# Patient Record
Sex: Female | Born: 1960 | Race: White | Hispanic: No | Marital: Married | Smoking: Never smoker
Health system: Southern US, Community
[De-identification: ages and names within clinical notes are randomized; demographics above are authoritative.]

## PROBLEM LIST (undated history)

## (undated) DIAGNOSIS — M1990 Unspecified osteoarthritis, unspecified site: Secondary | ICD-10-CM

## (undated) DIAGNOSIS — K219 Gastro-esophageal reflux disease without esophagitis: Secondary | ICD-10-CM

## (undated) DIAGNOSIS — I10 Essential (primary) hypertension: Secondary | ICD-10-CM

## (undated) HISTORY — PX: WISDOM TOOTH EXTRACTION: SHX21

## (undated) HISTORY — PX: ABDOMINAL HYSTERECTOMY: SHX81

## (undated) HISTORY — PX: MENISCUS REPAIR: SHX5179

## (undated) HISTORY — PX: COLONOSCOPY: SHX174

## (undated) HISTORY — PX: OOPHORECTOMY: SHX86

## (undated) HISTORY — PX: TONSILLECTOMY: SUR1361

## (undated) HISTORY — DX: Essential (primary) hypertension: I10

---

## 2009-07-09 ENCOUNTER — Encounter: Admission: RE | Admit: 2009-07-09 | Discharge: 2009-07-09 | Payer: Self-pay | Admitting: Obstetrics and Gynecology

## 2009-08-04 ENCOUNTER — Encounter (INDEPENDENT_AMBULATORY_CARE_PROVIDER_SITE_OTHER): Payer: Self-pay | Admitting: *Deleted

## 2009-08-08 HISTORY — PX: COLONOSCOPY: SHX174

## 2009-08-10 ENCOUNTER — Encounter (INDEPENDENT_AMBULATORY_CARE_PROVIDER_SITE_OTHER): Payer: Self-pay

## 2009-08-12 ENCOUNTER — Ambulatory Visit: Payer: Self-pay | Admitting: Gastroenterology

## 2010-02-01 ENCOUNTER — Ambulatory Visit (HOSPITAL_BASED_OUTPATIENT_CLINIC_OR_DEPARTMENT_OTHER): Admission: RE | Admit: 2010-02-01 | Discharge: 2010-02-01 | Payer: Self-pay | Admitting: Orthopedic Surgery

## 2010-09-07 NOTE — Miscellaneous (Signed)
Summary: Lec previsit  Clinical Lists Changes  Medications: Added new medication of MOVIPREP 100 GM  SOLR (PEG-KCL-NACL-NASULF-NA ASC-C) As per prep instructions. - Signed Rx of MOVIPREP 100 GM  SOLR (PEG-KCL-NACL-NASULF-NA ASC-C) As per prep instructions.;  #1 x 0;  Signed;  Entered by: Ulis Rias RN;  Authorized by: Louis Meckel MD;  Method used: Electronically to [facility] [phone]*, [address] [place], [place], [place]  [address], Ph: [phone] or [phone], Fax: [phone] Observations: Added new observation of NKA: T (08/12/2009 9:50)    Prescriptions: MOVIPREP 100 GM  SOLR (PEG-KCL-NACL-NASULF-NA ASC-C) As per prep instructions.  #1 x 0   Entered by:   Ulis Rias RN   Authorized by:   Louis Meckel MD   Signed by:   Ulis Rias RN on 08/12/2009   Method used:   Electronically to        [facility] [phone]* (retail)       [address]       [place], [place]  [address]       Ph: [phone] or [phone]       Fax: [phone]   RxID:   [id number]

## 2010-09-07 NOTE — Letter (Signed)
Summary: [facility] Instructions  [facility]  [address] [place], [place] [address]   Phone: [phone]  Fax: [phone]       [facility]    Jul 21, 1961    MRN: [id number]        Procedure Day Dorna Bloom: Rica Mote. 01/25     Arrival Time:  10:30am     Procedure Time:  11:30am     Location of Procedure:                    _x _  [facility] (4th Floor)                        PREPARATION FOR COLONOSCOPY WITH MOVIPREP   Starting 5 days prior to your procedure 01/20 . do not eat nuts, seeds, popcorn, corn, beans, peas,  salads, or any raw vegetables.  Do not take any fiber supplements (e.g. Metamucil, Citrucel, and Benefiber).  THE DAY BEFORE YOUR PROCEDURE      DATE: 1/24, Mon.  1.  Drink clear liquids the entire day-NO SOLID FOOD  2.  Do not drink anything colored red or purple.  Avoid juices with pulp.  No orange juice.  3.  Drink at least 64 oz. (8 glasses) of fluid/clear liquids during the day to prevent dehydration and help the prep work efficiently.  CLEAR LIQUIDS INCLUDE: Water Jello Ice Popsicles Tea (sugar ok, no milk/cream) Powdered fruit flavored drinks Coffee (sugar ok, no milk/cream) Gatorade Juice: apple, white grape, white cranberry  Lemonade Clear bullion, consomm, broth Carbonated beverages (any kind) Strained chicken noodle soup Hard Candy                             4.  In the morning, mix first dose of MoviPrep solution:    Empty 1 Pouch A and 1 Pouch B into the disposable container    Add lukewarm drinking water to the top line of the container. Mix to dissolve    Refrigerate (mixed solution should be used within 24 hrs)  5.  Begin drinking the prep at 5:00 p.m. The MoviPrep container is divided by 4 marks.   Every 15 minutes drink the solution down to the next mark (approximately 8 oz) until the full liter is complete.   6.  Follow completed prep with 16 oz of clear liquid of your choice (Nothing red or  purple).  Continue to drink clear liquids until bedtime.  7.  Before going to bed, mix second dose of MoviPrep solution:    Empty 1 Pouch A and 1 Pouch B into the disposable container    Add lukewarm drinking water to the top line of the container. Mix to dissolve    Refrigerate  THE DAY OF YOUR PROCEDURE     DATE:Tues. 01/25  Beginning at 6:30am . (5 hours before procedure):         1. Every 15 minutes, drink the solution down to the next mark (approx 8 oz) until the full liter is complete.  2. Follow completed prep with 16 oz. of clear liquid of your choice.    3. You may drink clear liquids until 9:30am  (2 HOURS BEFORE PROCEDURE).   MEDICATION INSTRUCTIONS  Unless otherwise instructed, you should take regular prescription medications with a small sip of water   as early as possible the morning of your procedure.  OTHER INSTRUCTIONS  You will need a responsible adult at least 50 years of age to accompany you and drive you home.   This person must remain in the waiting room during your procedure.  Wear loose fitting clothing that is easily removed.  Leave jewelry and other valuables at home.  However, you may wish to bring a book to read or  an iPod/MP3 player to listen to music as you wait for your procedure to start.  Remove all body piercing jewelry and leave at home.  Total time from sign-in until discharge is approximately 2-3 hours.  You should go home directly after your procedure and rest.  You can resume normal activities the  day after your procedure.  The day of your procedure you should not:   Drive   Make legal decisions   Operate machinery   Drink alcohol   Return to work  You will receive specific instructions about eating, activities and medications before you leave.    The above instructions have been reviewed and explained to me by   Ulis Rias RN  August 12, 2009 10:15 AM     I fully understand and can verbalize these  instructions _____________________________ Date _________

## 2010-10-19 ENCOUNTER — Other Ambulatory Visit: Payer: Self-pay | Admitting: Obstetrics and Gynecology

## 2010-10-19 DIAGNOSIS — Z1231 Encounter for screening mammogram for malignant neoplasm of breast: Secondary | ICD-10-CM

## 2010-10-24 LAB — POCT HEMOGLOBIN-HEMACUE: Hemoglobin: 13.3 g/dL (ref 12.0–15.0)

## 2010-10-27 ENCOUNTER — Ambulatory Visit
Admission: RE | Admit: 2010-10-27 | Discharge: 2010-10-27 | Disposition: A | Discharge status: Home / Self Care | Payer: BC Managed Care – PPO | Source: Ambulatory Visit | Attending: Obstetrics and Gynecology | Admitting: Obstetrics and Gynecology

## 2010-10-27 DIAGNOSIS — Z1231 Encounter for screening mammogram for malignant neoplasm of breast: Secondary | ICD-10-CM

## 2011-11-08 ENCOUNTER — Other Ambulatory Visit: Payer: Self-pay | Admitting: Obstetrics and Gynecology

## 2011-11-08 DIAGNOSIS — Z1231 Encounter for screening mammogram for malignant neoplasm of breast: Secondary | ICD-10-CM

## 2011-11-17 ENCOUNTER — Ambulatory Visit
Admission: RE | Admit: 2011-11-17 | Discharge: 2011-11-17 | Disposition: A | Discharge status: Home / Self Care | Payer: BC Managed Care – PPO | Source: Ambulatory Visit | Attending: Obstetrics and Gynecology | Admitting: Obstetrics and Gynecology

## 2011-11-17 DIAGNOSIS — Z1231 Encounter for screening mammogram for malignant neoplasm of breast: Secondary | ICD-10-CM

## 2012-11-19 ENCOUNTER — Other Ambulatory Visit: Payer: Self-pay

## 2012-11-19 DIAGNOSIS — Z1231 Encounter for screening mammogram for malignant neoplasm of breast: Secondary | ICD-10-CM

## 2012-12-13 ENCOUNTER — Ambulatory Visit
Admission: RE | Admit: 2012-12-13 | Discharge: 2012-12-13 | Disposition: A | Discharge status: Home / Self Care | Payer: 59 | Source: Ambulatory Visit

## 2012-12-13 DIAGNOSIS — Z1231 Encounter for screening mammogram for malignant neoplasm of breast: Secondary | ICD-10-CM

## 2014-10-22 ENCOUNTER — Inpatient Hospital Stay (HOSPITAL_COMMUNITY)
Admission: RE | Admit: 2014-10-22 | Discharge: 2014-10-22 | Disposition: A | Discharge status: Home / Self Care | Payer: Self-pay | Source: Ambulatory Visit

## 2014-10-23 ENCOUNTER — Other Ambulatory Visit (HOSPITAL_COMMUNITY): Payer: Self-pay

## 2014-10-29 ENCOUNTER — Ambulatory Visit (HOSPITAL_COMMUNITY)
Admission: RE | Admit: 2014-10-29 | Payer: Managed Care, Other (non HMO) | Source: Ambulatory Visit | Admitting: Obstetrics and Gynecology

## 2014-10-29 ENCOUNTER — Encounter (HOSPITAL_COMMUNITY): Admission: RE | Payer: Self-pay | Source: Ambulatory Visit

## 2014-10-29 SURGERY — HYSTERECTOMY, VAGINAL
Anesthesia: General

## 2015-08-24 ENCOUNTER — Ambulatory Visit: Payer: Managed Care, Other (non HMO) | Admitting: Podiatry

## 2016-03-18 ENCOUNTER — Telehealth: Payer: Self-pay | Admitting: Gastroenterology

## 2016-03-18 NOTE — Telephone Encounter (Signed)
Received previous colon report and placed on Dr. Eugenia Pancoast desk for review

## 2016-03-24 NOTE — Telephone Encounter (Signed)
Dr.Jacobs has reviewed records and has noted for patient to have recall in 09/2019. Patient has been notified of this and recall will be placed on chart.

## 2016-05-30 NOTE — Patient Instructions (Signed)
Your procedure is scheduled on:  Monday, Nov. 6, 2017  Enter through the [employer] [facility] at:  6:00 AM  Pick up the phone at the desk and dial [phone].  Call this number if you have problems the morning of surgery: [phone].  Remember: Do NOT eat food or drink after:  Midnight Sunday, Nov. 5, 2017  Take these medicines the morning of surgery with a SIP OF WATER:  None  Stop ALL herbal medications at this time   Do NOT wear jewelry (body piercing), metal hair clips/bobby pins, make-up, or nail polish. Do NOT wear lotions, powders, or perfumes.  You may wear deodorant. Do NOT shave for 48 hours prior to surgery. Do NOT bring valuables to the hospital. Contacts, dentures, or bridgework may not be worn into surgery.  Leave suitcase in car.  After surgery it may be brought to your room.  For patients admitted to the hospital, checkout time is 11:00 AM the day of discharge.

## 2016-05-31 ENCOUNTER — Encounter (HOSPITAL_COMMUNITY)
Admission: RE | Admit: 2016-05-31 | Discharge: 2016-05-31 | Disposition: A | Discharge status: Home / Self Care | Payer: Managed Care, Other (non HMO) | Source: Ambulatory Visit | Attending: Obstetrics and Gynecology | Admitting: Obstetrics and Gynecology

## 2016-05-31 ENCOUNTER — Encounter (HOSPITAL_COMMUNITY): Payer: Self-pay

## 2016-05-31 DIAGNOSIS — Z01812 Encounter for preprocedural laboratory examination: Secondary | ICD-10-CM | POA: Insufficient documentation

## 2016-05-31 HISTORY — DX: Unspecified osteoarthritis, unspecified site: M19.90

## 2016-05-31 LAB — COMPREHENSIVE METABOLIC PANEL
ALK PHOS: 68 U/L (ref 38–126)
ALT: 15 U/L (ref 14–54)
AST: 21 U/L (ref 15–41)
Albumin: 4.2 g/dL (ref 3.5–5.0)
Anion gap: 6 (ref 5–15)
BILIRUBIN TOTAL: 0.7 mg/dL (ref 0.3–1.2)
BUN: 21 mg/dL — AB (ref 6–20)
CALCIUM: 9.6 mg/dL (ref 8.9–10.3)
CO2: 29 mmol/L (ref 22–32)
CREATININE: 0.88 mg/dL (ref 0.44–1.00)
Chloride: 104 mmol/L (ref 101–111)
GFR calc Af Amer: >60 mL/min (ref >60)
GLUCOSE: 102 mg/dL — AB (ref 65–99)
Potassium: 4.2 mmol/L (ref 3.5–5.1)
Sodium: 139 mmol/L (ref 135–145)
TOTAL PROTEIN: 7 g/dL (ref 6.5–8.1)

## 2016-05-31 LAB — TYPE AND SCREEN
ABO/RH(D): A POS
Antibody Screen: NEGATIVE

## 2016-05-31 LAB — CBC
HEMATOCRIT: 38.9 % (ref 36.0–46.0)
HEMOGLOBIN: 13 g/dL (ref 12.0–15.0)
MCH: 28.9 pg (ref 26.0–34.0)
MCHC: 33.4 g/dL (ref 30.0–36.0)
MCV: 86.4 fL (ref 78.0–100.0)
Platelets: 258 10*3/uL (ref 150–400)
RBC: 4.5 MIL/uL (ref 3.87–5.11)
RDW: 13 % (ref 11.5–15.5)
WBC: 10 10*3/uL (ref 4.0–10.5)

## 2016-05-31 LAB — ABO/RH: ABO/RH(D): A POS

## 2016-06-12 ENCOUNTER — Encounter (HOSPITAL_COMMUNITY): Payer: Self-pay | Admitting: Anesthesiology

## 2016-06-12 NOTE — H&P (Signed)
Heidi Reeves is an 55 y.o. female G27P2 Pt here for pelvic prolapse surgery planned last year but delayed due to her needing to care for her father who has since passed away from Stage 4 small cell lung cancer.  In her initial workup, Went through urodynamics with Dr. Jeni Salles and he felt she did not need to get a transurethral sling, but may benefit from mesh placement with an anterior repair and vault suspension. She does not want mesh, but just a hysterectomy and A/P Repair.   Since last year, her bladder urgency has increased, and she does have a couple of accidents here and there as well as mild SUI, but does not wear pads daily. Still sexually active and using lube.    She has a h/o oophorectomy (she thinks left)  for a large ovarian cyst, but not sure if tube removed.   She would like to retain her remaining ovary if not abnormal, ok with removing tubes if able.   History: Previous GYN Procedures: oophorectomy  Last pap: normal Date: 2015 and HPV negative OB History:  NSVD  X 2   Menstrual History:  No LMP recorded. Patient is not currently having periods (Reason: Perimenopausal).    Past Medical History:  Diagnosis Date  . Arthritis    bil knees    Past Surgical History:  Procedure Laterality Date  . COLONOSCOPY    . MENISCUS REPAIR    . OOPHORECTOMY      History reviewed. No pertinent family history.  Social History:  reports that she has never smoked. She has never used smokeless tobacco. She reports that she drinks alcohol. She reports that she does not use drugs.  Allergies: No Known Allergies  No prescriptions prior to admission.    Review of Systems  Gastrointestinal: Negative for abdominal pain and constipation.  Genitourinary: Positive for urgency.    There were no vitals taken for this visit. Physical Exam  Constitutional: She appears well-developed and well-nourished.  Cardiovascular: Normal rate and regular rhythm.   Respiratory: Effort normal.   Genitourinary:  Genitourinary Comments: Cystocele visible at os, Grade 3 Uterine Prolapse Grade 3 normal size Rectocele Grade 2   Neurological: She is alert.  Psychiatric: She has a normal mood and affect.    No results found for this or any previous visit (from the past 24 hour(s)).  No results found.  Assessment/Plan: : I reviewed with pt again pelvic prolapse in detail since has been over a year since we discussed. We discussed Dr. Coralee North findings with urodynamics and that he felt she could forego a transurethral sling for now. We discussed his recommendations of using mesh in her anterior repair and a vault suspension to cut down on recurrence in the future. We discussed pluses and minuses and she is just unsure she wishes to go with mesh. I reviewed with her a TVH/possible bilateral salpingectomy with anterior and posterior repair in detail and she would like to proceed with that. She realizes that there is a higher rate of recurrence without mesh, but would like to try without it initially.  Logan Bores 06/12/2016, 3:42 PM

## 2016-06-12 NOTE — Anesthesia Preprocedure Evaluation (Addendum)
Anesthesia Evaluation  Patient identified by MRN, date of birth, ID band Patient awake    Reviewed: Allergy & Precautions, NPO status , Patient's Chart, lab work & pertinent test results  Airway Mallampati: III  TM Distance: >3 FB Neck ROM: Full    Dental no notable dental hx. (+) Teeth Intact   Pulmonary neg pulmonary ROS,    Pulmonary exam normal breath sounds clear to auscultation       Cardiovascular negative cardio ROS Normal cardiovascular exam Rhythm:Regular Rate:Normal     Neuro/Psych negative neurological ROS  negative psych ROS   GI/Hepatic Neg liver ROS, Rectocele   Endo/Other  Obesity  Renal/GU negative Renal ROS   Cystocele    Musculoskeletal  (+) Arthritis , Osteoarthritis,  Bilateral knees   Abdominal (+) + obese,   Peds  Hematology negative hematology ROS (+)   Anesthesia Other Findings   Reproductive/Obstetrics Pelvic prolapse                              Chemistry      Component Value Date/Time   NA 139 05/31/2016 1505   K 4.2 05/31/2016 1505   CL 104 05/31/2016 1505   CO2 29 05/31/2016 1505   BUN 21 (H) 05/31/2016 1505   CREATININE 0.88 05/31/2016 1505      Component Value Date/Time   CALCIUM 9.6 05/31/2016 1505   ALKPHOS 68 05/31/2016 1505   AST 21 05/31/2016 1505   ALT 15 05/31/2016 1505   BILITOT 0.7 05/31/2016 1505     Lab Results  Component Value Date   WBC 10.0 05/31/2016   HGB 13.0 05/31/2016   HCT 38.9 05/31/2016   MCV 86.4 05/31/2016   PLT 258 05/31/2016     Anesthesia Physical Anesthesia Plan  ASA: II  Anesthesia Plan: General   Post-op Pain Management:    Induction: Intravenous  Airway Management Planned: Oral ETT  Additional Equipment:   Intra-op Plan:   Post-operative Plan: Extubation in OR  Informed Consent: I have reviewed the patients History and Physical, chart, labs and discussed the procedure including the risks,  benefits and alternatives for the proposed anesthesia with the patient or authorized representative who has indicated his/her understanding and acceptance.   Dental advisory given  Plan Discussed with: Anesthesiologist, CRNA and Surgeon  Anesthesia Plan Comments:         Anesthesia Quick Evaluation

## 2016-06-13 ENCOUNTER — Encounter (HOSPITAL_COMMUNITY)
Admission: RE | Disposition: A | Discharge status: Home / Self Care | Payer: Self-pay | Source: Ambulatory Visit | Attending: Obstetrics and Gynecology

## 2016-06-13 ENCOUNTER — Encounter (HOSPITAL_COMMUNITY): Payer: Self-pay

## 2016-06-13 ENCOUNTER — Ambulatory Visit (HOSPITAL_COMMUNITY): Payer: Managed Care, Other (non HMO) | Admitting: Anesthesiology

## 2016-06-13 ENCOUNTER — Ambulatory Visit (HOSPITAL_COMMUNITY)
Admission: RE | Admit: 2016-06-13 | Discharge: 2016-06-14 | Disposition: A | Discharge status: Home / Self Care | Payer: Managed Care, Other (non HMO) | Source: Ambulatory Visit | Attending: Obstetrics and Gynecology | Admitting: Obstetrics and Gynecology

## 2016-06-13 DIAGNOSIS — N72 Inflammatory disease of cervix uteri: Secondary | ICD-10-CM | POA: Insufficient documentation

## 2016-06-13 DIAGNOSIS — Z90710 Acquired absence of both cervix and uterus: Secondary | ICD-10-CM | POA: Diagnosis present

## 2016-06-13 DIAGNOSIS — E669 Obesity, unspecified: Secondary | ICD-10-CM | POA: Diagnosis not present

## 2016-06-13 DIAGNOSIS — M170 Bilateral primary osteoarthritis of knee: Secondary | ICD-10-CM | POA: Insufficient documentation

## 2016-06-13 DIAGNOSIS — N800 Endometriosis of uterus: Secondary | ICD-10-CM | POA: Diagnosis not present

## 2016-06-13 DIAGNOSIS — N841 Polyp of cervix uteri: Secondary | ICD-10-CM | POA: Diagnosis not present

## 2016-06-13 DIAGNOSIS — N814 Uterovaginal prolapse, unspecified: Secondary | ICD-10-CM | POA: Diagnosis not present

## 2016-06-13 DIAGNOSIS — Z6834 Body mass index (BMI) 34.0-34.9, adult: Secondary | ICD-10-CM | POA: Diagnosis not present

## 2016-06-13 DIAGNOSIS — D251 Intramural leiomyoma of uterus: Secondary | ICD-10-CM | POA: Insufficient documentation

## 2016-06-13 DIAGNOSIS — Z23 Encounter for immunization: Secondary | ICD-10-CM | POA: Insufficient documentation

## 2016-06-13 DIAGNOSIS — N816 Rectocele: Secondary | ICD-10-CM

## 2016-06-13 HISTORY — PX: VAGINAL HYSTERECTOMY: SHX2639

## 2016-06-13 HISTORY — PX: ANTERIOR AND POSTERIOR REPAIR: SHX5121

## 2016-06-13 SURGERY — HYSTERECTOMY, VAGINAL
Anesthesia: General | Site: Vagina

## 2016-06-13 MED ORDER — PROPOFOL 10 MG/ML IV BOLUS
INTRAVENOUS | Status: DC | PRN
  Administered 2016-06-13: 200 mg via INTRAVENOUS

## 2016-06-13 MED ORDER — ONDANSETRON HCL 4 MG/2ML IJ SOLN: 4.0000 mg | Freq: Four times a day (QID) | INTRAMUSCULAR | Status: DC | PRN

## 2016-06-13 MED ORDER — SUGAMMADEX SODIUM 200 MG/2ML IV SOLN
INTRAVENOUS | Status: AC
  Filled 2016-06-13: qty 2

## 2016-06-13 MED ORDER — METOCLOPRAMIDE HCL 5 MG/ML IJ SOLN: 10.0000 mg | Freq: Once | INTRAMUSCULAR | Status: DC | PRN

## 2016-06-13 MED ORDER — KETOROLAC TROMETHAMINE 30 MG/ML IJ SOLN
30.0000 mg | Freq: Four times a day (QID) | INTRAMUSCULAR | Status: AC
  Administered 2016-06-14: 30 mg via INTRAVENOUS
  Filled 2016-06-13: qty 1

## 2016-06-13 MED ORDER — LIDOCAINE HCL (CARDIAC) 20 MG/ML IV SOLN
INTRAVENOUS | Status: AC
  Filled 2016-06-13: qty 5

## 2016-06-13 MED ORDER — SODIUM CHLORIDE 0.9% FLUSH: 9.0000 mL | INTRAVENOUS | Status: DC | PRN

## 2016-06-13 MED ORDER — ESTRADIOL 0.1 MG/GM VA CREA
TOPICAL_CREAM | VAGINAL | Status: DC | PRN
  Administered 2016-06-13: 1 via VAGINAL

## 2016-06-13 MED ORDER — HYDROMORPHONE HCL 1 MG/ML IJ SOLN
INTRAMUSCULAR | Status: AC
  Filled 2016-06-13: qty 1

## 2016-06-13 MED ORDER — NALOXONE HCL 0.4 MG/ML IJ SOLN: 0.4000 mg | INTRAMUSCULAR | Status: DC | PRN

## 2016-06-13 MED ORDER — LIDOCAINE HCL (CARDIAC) 20 MG/ML IV SOLN
INTRAVENOUS | Status: DC | PRN
  Administered 2016-06-13: 100 mg via INTRAVENOUS

## 2016-06-13 MED ORDER — INFLUENZA VAC SPLIT QUAD 0.5 ML IM SUSY
0.5000 mL | PREFILLED_SYRINGE | INTRAMUSCULAR | Status: AC
  Administered 2016-06-14: 0.5 mL via INTRAMUSCULAR

## 2016-06-13 MED ORDER — SODIUM CHLORIDE 0.9 % IJ SOLN
INTRAMUSCULAR | Status: AC
  Filled 2016-06-13: qty 100

## 2016-06-13 MED ORDER — ALUM & MAG HYDROXIDE-SIMETH 200-200-20 MG/5ML PO SUSP: 30.0000 mL | ORAL | Status: DC | PRN

## 2016-06-13 MED ORDER — MIDAZOLAM HCL 2 MG/2ML IJ SOLN
INTRAMUSCULAR | Status: AC
  Filled 2016-06-13: qty 2

## 2016-06-13 MED ORDER — DEXAMETHASONE SODIUM PHOSPHATE 10 MG/ML IJ SOLN
INTRAMUSCULAR | Status: DC | PRN
  Administered 2016-06-13: 10 mg via INTRAVENOUS

## 2016-06-13 MED ORDER — SUGAMMADEX SODIUM 200 MG/2ML IV SOLN
INTRAVENOUS | Status: DC | PRN
  Administered 2016-06-13: 200 mg via INTRAVENOUS

## 2016-06-13 MED ORDER — MEPERIDINE HCL 25 MG/ML IJ SOLN: 6.2500 mg | INTRAMUSCULAR | Status: DC | PRN

## 2016-06-13 MED ORDER — HYDROMORPHONE HCL 1 MG/ML IJ SOLN
INTRAMUSCULAR | Status: DC | PRN
  Administered 2016-06-13 (×2): 0.5 mg via INTRAVENOUS

## 2016-06-13 MED ORDER — LACTATED RINGERS IV SOLN
INTRAVENOUS | Status: DC
  Administered 2016-06-13: 17:00:00 via INTRAVENOUS

## 2016-06-13 MED ORDER — OXYCODONE-ACETAMINOPHEN 5-325 MG PO TABS
1.0000 | ORAL_TABLET | ORAL | Status: DC | PRN
  Administered 2016-06-14 (×2): 1 via ORAL
  Filled 2016-06-13: qty 2

## 2016-06-13 MED ORDER — ONDANSETRON HCL 4 MG/2ML IJ SOLN
INTRAMUSCULAR | Status: DC | PRN
  Administered 2016-06-13: 4 mg via INTRAVENOUS

## 2016-06-13 MED ORDER — ONDANSETRON HCL 4 MG/2ML IJ SOLN
INTRAMUSCULAR | Status: AC
  Filled 2016-06-13: qty 2

## 2016-06-13 MED ORDER — KETOROLAC TROMETHAMINE 30 MG/ML IJ SOLN
INTRAMUSCULAR | Status: DC | PRN
  Administered 2016-06-13: 30 mg via INTRAVENOUS

## 2016-06-13 MED ORDER — PROPOFOL 10 MG/ML IV BOLUS
INTRAVENOUS | Status: AC
  Filled 2016-06-13: qty 20

## 2016-06-13 MED ORDER — MIDAZOLAM HCL 5 MG/5ML IJ SOLN
INTRAMUSCULAR | Status: DC | PRN
  Administered 2016-06-13: 2 mg via INTRAVENOUS

## 2016-06-13 MED ORDER — KETOROLAC TROMETHAMINE 30 MG/ML IJ SOLN
INTRAMUSCULAR | Status: AC
Start: 2016-06-13 — End: 2016-06-13
  Filled 2016-06-13: qty 1

## 2016-06-13 MED ORDER — ROCURONIUM BROMIDE 100 MG/10ML IV SOLN
INTRAVENOUS | Status: DC | PRN
  Administered 2016-06-13: 50 mg via INTRAVENOUS

## 2016-06-13 MED ORDER — SCOPOLAMINE 1 MG/3DAYS TD PT72
1.0000 | MEDICATED_PATCH | Freq: Once | TRANSDERMAL | Status: DC
  Administered 2016-06-13: 1.5 mg via TRANSDERMAL

## 2016-06-13 MED ORDER — DIPHENHYDRAMINE HCL 12.5 MG/5ML PO ELIX: 12.5000 mg | ORAL_SOLUTION | Freq: Four times a day (QID) | ORAL | Status: DC | PRN

## 2016-06-13 MED ORDER — FENTANYL CITRATE (PF) 100 MCG/2ML IJ SOLN
INTRAMUSCULAR | Status: DC | PRN
Start: 2016-06-13 — End: 2016-06-13
  Administered 2016-06-13 (×2): 50 ug via INTRAVENOUS
  Administered 2016-06-13: 150 ug via INTRAVENOUS

## 2016-06-13 MED ORDER — HYDROMORPHONE HCL 1 MG/ML IJ SOLN
0.2500 mg | INTRAMUSCULAR | Status: DC | PRN
  Administered 2016-06-13: 0.25 mg via INTRAVENOUS
  Administered 2016-06-13: 0.5 mg via INTRAVENOUS
  Administered 2016-06-13: 0.25 mg via INTRAVENOUS

## 2016-06-13 MED ORDER — SIMETHICONE 80 MG PO CHEW: 80.0000 mg | CHEWABLE_TABLET | Freq: Four times a day (QID) | ORAL | Status: DC | PRN

## 2016-06-13 MED ORDER — HYDROMORPHONE 1 MG/ML IV SOLN
INTRAVENOUS | Status: DC
  Administered 2016-06-13: 12:00:00 via INTRAVENOUS
  Administered 2016-06-13: 4 mg via INTRAVENOUS
  Administered 2016-06-14 (×2): 0.4 mg via INTRAVENOUS
  Administered 2016-06-14: 1 mg via INTRAVENOUS
  Filled 2016-06-13 (×2): qty 25

## 2016-06-13 MED ORDER — FENTANYL CITRATE (PF) 250 MCG/5ML IJ SOLN
INTRAMUSCULAR | Status: AC
  Filled 2016-06-13: qty 5

## 2016-06-13 MED ORDER — ROCURONIUM BROMIDE 100 MG/10ML IV SOLN
INTRAVENOUS | Status: AC
  Filled 2016-06-13: qty 1

## 2016-06-13 MED ORDER — KETOROLAC TROMETHAMINE 30 MG/ML IJ SOLN: 30.0000 mg | Freq: Four times a day (QID) | INTRAMUSCULAR | Status: AC

## 2016-06-13 MED ORDER — SCOPOLAMINE 1 MG/3DAYS TD PT72
MEDICATED_PATCH | TRANSDERMAL | Status: AC
  Administered 2016-06-13: 1.5 mg via TRANSDERMAL
  Filled 2016-06-13: qty 1

## 2016-06-13 MED ORDER — DEXAMETHASONE SODIUM PHOSPHATE 10 MG/ML IJ SOLN
INTRAMUSCULAR | Status: AC
  Filled 2016-06-13: qty 1

## 2016-06-13 MED ORDER — DIPHENHYDRAMINE HCL 50 MG/ML IJ SOLN: 12.5000 mg | Freq: Four times a day (QID) | INTRAMUSCULAR | Status: DC | PRN

## 2016-06-13 MED ORDER — LACTATED RINGERS IV SOLN
INTRAVENOUS | Status: DC
  Administered 2016-06-13: 125 mL/h via INTRAVENOUS
  Administered 2016-06-13 (×2): via INTRAVENOUS

## 2016-06-13 MED ORDER — VASOPRESSIN 20 UNIT/ML IV SOLN
INTRAVENOUS | Status: DC | PRN
  Administered 2016-06-13: 10 mL via INTRAMUSCULAR
  Administered 2016-06-13: 5 mL via INTRAMUSCULAR
  Administered 2016-06-13: 8 mL via INTRAMUSCULAR

## 2016-06-13 MED ORDER — IBUPROFEN 600 MG PO TABS: 600.0000 mg | ORAL_TABLET | Freq: Four times a day (QID) | ORAL | Status: DC | PRN

## 2016-06-13 MED ORDER — CEFAZOLIN SODIUM-DEXTROSE 2-4 GM/100ML-% IV SOLN
2.0000 g | INTRAVENOUS | Status: AC
  Administered 2016-06-13: 2 g via INTRAVENOUS

## 2016-06-13 MED ORDER — BUPIVACAINE HCL (PF) 0.5 % IJ SOLN
INTRAMUSCULAR | Status: AC
  Filled 2016-06-13: qty 30

## 2016-06-13 MED ORDER — VASOPRESSIN 20 UNIT/ML IV SOLN
INTRAVENOUS | Status: AC
  Filled 2016-06-13: qty 1

## 2016-06-13 MED ORDER — ESTRADIOL 0.1 MG/GM VA CREA
TOPICAL_CREAM | VAGINAL | Status: AC
  Filled 2016-06-13: qty 42.5

## 2016-06-13 MED ORDER — LACTATED RINGERS IV SOLN: INTRAVENOUS | Status: DC

## 2016-06-13 MED ORDER — MENTHOL 3 MG MT LOZG: 1.0000 | LOZENGE | OROMUCOSAL | Status: DC | PRN

## 2016-06-13 SURGICAL SUPPLY — 35 items
BLADE SURG 15 STRL LF C SS BP (BLADE) ×2 IMPLANT
BLADE SURG 15 STRL SS (BLADE) ×1
CANISTER SUCT 3000ML (MISCELLANEOUS) ×3 IMPLANT
CLOTH BEACON ORANGE TIMEOUT ST (SAFETY) ×3 IMPLANT
CONT PATH 16OZ SNAP LID 3702 (MISCELLANEOUS) ×3 IMPLANT
DECANTER SPIKE VIAL GLASS SM (MISCELLANEOUS) ×6 IMPLANT
GAUZE PACKING 1 X5 YD ST (GAUZE/BANDAGES/DRESSINGS) ×3 IMPLANT
GAUZE PACKING 2X5 YD STRL (GAUZE/BANDAGES/DRESSINGS) IMPLANT
GLOVE BIO SURGEON STRL SZ 6.5 (GLOVE) ×6 IMPLANT
GLOVE BIOGEL PI IND STRL 6.5 (GLOVE) ×4 IMPLANT
GLOVE BIOGEL PI IND STRL 7.0 (GLOVE) ×2 IMPLANT
GLOVE BIOGEL PI INDICATOR 6.5 (GLOVE) ×2
GLOVE BIOGEL PI INDICATOR 7.0 (GLOVE) ×1
GLOVE ORTHO TXT STRL SZ7.5 (GLOVE) ×3 IMPLANT
GOWN STRL REUS W/TWL LRG LVL3 (GOWN DISPOSABLE) ×15 IMPLANT
NEEDLE HYPO 22GX1.5 SAFETY (NEEDLE) ×3 IMPLANT
NEEDLE MAYO CATGUT SZ4 (NEEDLE) IMPLANT
NEEDLE SPNL 22GX3.5 QUINCKE BK (NEEDLE) ×3 IMPLANT
NS IRRIG 1000ML POUR BTL (IV SOLUTION) ×3 IMPLANT
PACK TRENDGUARD 600 HYBRD PROC (MISCELLANEOUS) IMPLANT
PACK VAGINAL WOMENS (CUSTOM PROCEDURE TRAY) ×3 IMPLANT
PAD OB MATERNITY 4.3X12.25 (PERSONAL CARE ITEMS) ×3 IMPLANT
SUT PROLENE 1 CT 1 30 (SUTURE) IMPLANT
SUT VIC AB 0 CT1 18XCR BRD8 (SUTURE) ×6 IMPLANT
SUT VIC AB 0 CT1 27 (SUTURE) ×1
SUT VIC AB 0 CT1 27XBRD ANBCTR (SUTURE) ×2 IMPLANT
SUT VIC AB 0 CT1 8-18 (SUTURE) ×3
SUT VIC AB 2-0 CT1 27 (SUTURE) ×3
SUT VIC AB 2-0 CT1 TAPERPNT 27 (SUTURE) ×6 IMPLANT
SUT VIC AB 2-0 UR5 27 (SUTURE) IMPLANT
SUT VICRYL 0 TIES 12 18 (SUTURE) ×3 IMPLANT
TOWEL OR 17X24 6PK STRL BLUE (TOWEL DISPOSABLE) ×6 IMPLANT
TRAY FOLEY CATH SILVER 14FR (SET/KITS/TRAYS/PACK) ×3 IMPLANT
TRENDGUARD 600 HYBRID PROC PK (MISCELLANEOUS)
WATER STERILE IRR 1000ML POUR (IV SOLUTION) IMPLANT

## 2016-06-13 NOTE — Anesthesia Postprocedure Evaluation (Signed)
Anesthesia Post Note  Patient: JASIBE VALLEZ  Procedure(s) Performed: Procedure(s) (LRB): HYSTERECTOMY VAGINAL (N/A) ANTERIOR (CYSTOCELE) AND POSTERIOR REPAIR (RECTOCELE) (N/A)  Patient location during evaluation: PACU Anesthesia Type: General Level of consciousness: awake and alert and oriented Pain management: pain level controlled Vital Signs Assessment: post-procedure vital signs reviewed and stable Respiratory status: spontaneous breathing, nonlabored ventilation, respiratory function stable and patient connected to nasal cannula oxygen Cardiovascular status: blood pressure returned to baseline and stable Postop Assessment: no signs of nausea or vomiting Anesthetic complications: no     Last Vitals:  Vitals:   06/13/16 0930 06/13/16 0945  BP: (!) 119/55 (!) 145/84  Pulse: (!) 56 (!) 52  Resp: 14 14  Temp:      Last Pain:  Vitals:   06/13/16 0945  TempSrc:   PainSc: 4    Pain Goal: Patients Stated Pain Goal: 3 (06/13/16 0630)               Jorje Vanatta A.

## 2016-06-13 NOTE — Anesthesia Postprocedure Evaluation (Signed)
Anesthesia Post Note  Patient: DEAVION OLIVEROS  Procedure(s) Performed: Procedure(s) (LRB): HYSTERECTOMY VAGINAL (N/A) ANTERIOR (CYSTOCELE) AND POSTERIOR REPAIR (RECTOCELE) (N/A)  Patient location during evaluation: [facility] Anesthesia Type: General Level of consciousness: awake and alert and oriented Pain management: pain level controlled Vital Signs Assessment: post-procedure vital signs reviewed and stable Respiratory status: spontaneous breathing, nonlabored ventilation and respiratory function stable Cardiovascular status: stable Postop Assessment: no signs of nausea or vomiting and adequate PO intake Anesthetic complications: no     Last Vitals:  Vitals:   06/13/16 1200 06/13/16 1311  BP: (!) 180/82 (!) 160/116  Pulse: (!) 57 72  Resp: 16 14  Temp: 36.6 C     Last Pain:  Vitals:   06/13/16 1223  TempSrc:   PainSc: 4    Pain Goal: Patients Stated Pain Goal: 3 (06/13/16 0630)               Willa Rough

## 2016-06-13 NOTE — Progress Notes (Signed)
Patient ID: Heidi Reeves, female   DOB: Apr 20, 1961, 55 y.o.   MRN: [id number] Per pt no changes in dictated H&P and brief exam WNL.  Ready to proceed.

## 2016-06-13 NOTE — Addendum Note (Signed)
Addendum  created 06/13/16 1420 by Jonna Munro, CRNA   Sign clinical note

## 2016-06-13 NOTE — Anesthesia Procedure Notes (Signed)
Procedure Name: Intubation Date/Time: 06/13/2016 7:25 AM Performed by: Riki Sheer Pre-anesthesia Checklist: Emergency Drugs available, Patient identified, Suction available, Patient being monitored and Timeout performed Patient Re-evaluated:Patient Re-evaluated prior to inductionOxygen Delivery Method: Circle system utilized Preoxygenation: Pre-oxygenation with 100% oxygen Intubation Type: IV induction Ventilation: Mask ventilation without difficulty Laryngoscope Size: Miller and 2 Grade View: Grade I Tube type: Oral Tube size: 7.0 mm Number of attempts: 1 Airway Equipment and Method: Stylet Placement Confirmation: ETT inserted through vocal cords under direct vision,  positive ETCO2,  CO2 detector and breath sounds checked- equal and bilateral Secured at: 21 cm Tube secured with: Tape Dental Injury: Teeth and Oropharynx as per pre-operative assessment

## 2016-06-13 NOTE — Progress Notes (Signed)
Day of Surgery Procedure(s) (LRB): HYSTERECTOMY VAGINAL (N/A) ANTERIOR (CYSTOCELE) AND POSTERIOR REPAIR (RECTOCELE) (N/A)  Subjective: Patient reports tolerating PO.  Pain controlled with PCA  Objective: I have reviewed patient's vital signs and intake and output.  General: alert and cooperative GI: soft NT  Assessment: s/p Procedure(s): HYSTERECTOMY VAGINAL (N/A) ANTERIOR (CYSTOCELE) AND POSTERIOR REPAIR (RECTOCELE) (N/A): stable and progressing well  Plan: Will d/c foley and change to po meds in AM BP improved on last 2 readings, will just follow for now as pt has no history of CHTN.  She feels is due to anxiety.  LOS: 0 days    Icel Castles W 06/13/2016, 5:32 PM

## 2016-06-13 NOTE — Transfer of Care (Signed)
Immediate Anesthesia Transfer of Care Note  Patient: JC YARWOOD  Procedure(s) Performed: Procedure(s): HYSTERECTOMY VAGINAL (N/A) ANTERIOR (CYSTOCELE) AND POSTERIOR REPAIR (RECTOCELE) (N/A)  Patient Location: PACU  Anesthesia Type:General  Level of Consciousness: awake, alert  and oriented  Airway & Oxygen Therapy: Patient Spontanous Breathing and Patient connected to nasal cannula oxygen  Post-op Assessment: Report given to RN and Post -op Vital signs reviewed and stable  Post vital signs: Reviewed and stable  Last Vitals:  Vitals:   06/13/16 0630  BP: (!) 155/106  Pulse: 77  Resp: 16  Temp: 36.5 C    Last Pain:  Vitals:   06/13/16 0630  TempSrc: Oral      Patients Stated Pain Goal: 3 ([id number])  Complications: No apparent anesthesia complications

## 2016-06-13 NOTE — Op Note (Signed)
Operative Note    Preoperative Diagnosis Uterine prolapse Cystocele Rectocele  Postoperative Diagnosis Same with mesenteric adhesions to left fundus  Procedure Vaginal hysterectomy with LOA at left fundus Anterior and Posterior Repair  Surgeon Paula Compton, MD Janyth Contes MD  Anesthesia GETA  Fluids: EBL 153ml UOP 174ml concentrated clear IVF 2000cc LR  Findings Grade 3 cystocele with accompanying uterine prolapse and Grade 2 rectocele.  Uterus normal size with mesenteric adhesions on left fundus and adnexal area.  Right ovary and tube not visible.  Specimen Uterus and cervix  Procedure Note Patient was taken to the operating room where general anesthesia was obtained without difficulty. She was then prepped and draped in the normal sterile fashion in the dorsal lithotomy position. An appropriate timeout was performed.  Attention was then turned to the vagina and the cervix was grasped with Yates Decamp tenaculums x 2 and injected with a dilute solution of Pitressin circumferentially.  The bovie was then used to make a circumferential incision.  The mayo scissors then further dissected the vaginal mucosa from the underlying cervix and the  posterior cul de sac entered sharply.  The anterior cul de sac was dissected away from the uterine fundus and the peritoneal reflection entered sharply when visible.  With a banana speculum and deaver retractor isolating the uterus from the bladder and rectum.  The uterosacral ligaments and paracervical tissue was taken down sequentially with zeppelin clamps and suture ligated with zero vicryl at each step.  When  the was uterus freed on the patient's right, it was then delivered and the remaining tissue on the left carefully inspected due to some mesenteric adhesions to the left fundus and adnexal area.  These were carefully dissected both bluntly and with mesenbam scissors to free the utero-ovarian ligament.  Once the ligament was  free, the pedicle was clamped and transected completely freeing the uterus.  It was handed off to pathology. All pedicles were carefully inspected and seemed hemostatic.   The uterosacral ligaments were then approximated with zero vicryl.  The short weighted speculum was placed and the cuff run with a running locked 2-0 vicryl for hemostasis.  Attention was then turned to the anterior vaginal cuff which was grasped with alyss clamps and a midline incision begun after injection with dilute pitressin.  The vaginal mucosa was underscored and dilated off the pubo-vesicle fascia with the cystocele freed from the mucosa.  The fascia was then re-approximated with 0 vicryl interrupted sutures and the excess mucosa trimmed away.  The mucosa and the vaginal cuff were then closed with 2-0 vicryl in a running fashion.  Attention was finally turned posteriorly where the introitus was denuded and underscored in the midline with mayo scissors.  The vaginal mucosa was then dissected off the underlying puborectal fascia and reflected away from the midline.  The rectocele was then reduced and the puborectal fascia re-approximated with 0 vicryl interrupted sutures. The excess mucosa was then trimmed away and then closed with 2-0 vicryl in a running locked suture.  All appeared hemostatic and estrace coated vaginal packing placed.  Sponge, instrument and needle counts were correct.    Patient was then awakened and taken to the recovery room in good condition.

## 2016-06-14 ENCOUNTER — Encounter (HOSPITAL_COMMUNITY): Payer: Self-pay | Admitting: Obstetrics and Gynecology

## 2016-06-14 DIAGNOSIS — N814 Uterovaginal prolapse, unspecified: Secondary | ICD-10-CM | POA: Diagnosis not present

## 2016-06-14 LAB — BASIC METABOLIC PANEL
ANION GAP: 6 (ref 5–15)
BUN: 11 mg/dL (ref 6–20)
CALCIUM: 8.8 mg/dL — AB (ref 8.9–10.3)
CO2: 28 mmol/L (ref 22–32)
CREATININE: 0.66 mg/dL (ref 0.44–1.00)
Chloride: 102 mmol/L (ref 101–111)
Glucose, Bld: 111 mg/dL — ABNORMAL HIGH (ref 65–99)
Potassium: 4.1 mmol/L (ref 3.5–5.1)
Sodium: 136 mmol/L (ref 135–145)

## 2016-06-14 LAB — CBC
HCT: 31.1 % — ABNORMAL LOW (ref 36.0–46.0)
Hemoglobin: 10.6 g/dL — ABNORMAL LOW (ref 12.0–15.0)
MCH: 29.4 pg (ref 26.0–34.0)
MCHC: 34.1 g/dL (ref 30.0–36.0)
MCV: 86.4 fL (ref 78.0–100.0)
PLATELETS: 207 10*3/uL (ref 150–400)
RBC: 3.6 MIL/uL — ABNORMAL LOW (ref 3.87–5.11)
RDW: 13.3 % (ref 11.5–15.5)
WBC: 12.4 10*3/uL — AB (ref 4.0–10.5)

## 2016-06-14 MED ORDER — OXYCODONE-ACETAMINOPHEN 5-325 MG PO TABS: 1.0000 | ORAL_TABLET | ORAL | 0 refills | Status: DC | PRN

## 2016-06-14 MED ORDER — IBUPROFEN 600 MG PO TABS: 600.0000 mg | ORAL_TABLET | Freq: Four times a day (QID) | ORAL | 0 refills | Status: DC | PRN

## 2016-06-14 NOTE — Progress Notes (Signed)
Discharge teaching complete. Pt understood all information and did not have any questions. Pt ambulated out of the hospital and discharged home to family.  

## 2016-06-14 NOTE — Discharge Summary (Signed)
Physician Discharge Summary  Patient ID: Heidi Reeves MRN: [id number] DOB/AGE: 55-Feb-1962 55 y.o.  Admit date: 06/13/2016 Discharge date: 06/14/2016  Admission Diagnoses: Pelvic prolapse  Discharge Diagnoses:  Active Problems:   Cystocele with uterine prolapse   Rectocele   History of total vaginal hysterectomy (TVH)   Discharged Condition: good  Hospital Course: Pt admitted for routine post-operative care and by post-op day 1 was tolerating regular diet, ambulating and voiding without problem.  She was felt stable for d/c home.   Significant Diagnostic Studies: labs  Treatments: surgery: TVH with A&P Repair  Discharge Exam: Blood pressure (!) 108/59, pulse 67, temperature 98.1 F (36.7 C), temperature source Oral, resp. rate 17, SpO2 100 %. General appearance: alert and cooperative GI: soft NT Minimal vaginal bleeding after packing removed  Disposition: Final discharge disposition not confirmed  Discharge Instructions    Call MD for:  persistant nausea and vomiting    Complete by:  As directed    Call MD for:  severe uncontrolled pain    Complete by:  As directed    Call MD for:  temperature >100.4    Complete by:  As directed    Diet - low sodium heart healthy    Complete by:  As directed    Discharge instructions    Complete by:  As directed    Avoid driving for at least 1-2 weeks or until off narcotic pain meds.  No heavy lifting greater than 10 lbs.  Nothing in vagina for 6 weeks.   Increase activity slowly    Complete by:  As directed        Medication List    STOP taking these medications   acetaminophen 500 MG tablet Commonly known as:  TYLENOL   meloxicam 15 MG tablet Commonly known as:  MOBIC     TAKE these medications   glucosamine-chondroitin 500-400 MG tablet Take 1 tablet by mouth daily.   ibuprofen 600 MG tablet Commonly known as:  ADVIL,MOTRIN Take 1 tablet (600 mg total) by mouth every 6 (six) hours as needed (mild pain).    multivitamin with minerals Tabs tablet Take 1 tablet by mouth daily.   oxyCODONE-acetaminophen 5-325 MG tablet Commonly known as:  PERCOCET/ROXICET Take 1-2 tablets by mouth every 4 (four) hours as needed (moderate to severe pain (when tolerating fluids)).      Follow-up Information    Logan Bores, MD. Schedule an appointment as soon as possible for a visit in 6 week(s).   Specialty:  Obstetrics and Gynecology Why:  Postop check Contact information: [address] [place] [address] [phone]           Signed: Logan Bores 06/14/2016, 8:25 AM

## 2016-06-14 NOTE — Progress Notes (Signed)
1 Day Post-Op Procedure(s) (LRB): HYSTERECTOMY VAGINAL (N/A) ANTERIOR (CYSTOCELE) AND POSTERIOR REPAIR (RECTOCELE) (N/A)  Subjective: Patient reports tolerating PO and no problems voiding.  Has not taken po pain meds yet.  Objective: I have reviewed patient's vital signs, intake and output and labs.  General: alert and cooperative GI: soft NT Vaginal Bleeding: minimal, vaginal packing removed by RN  Assessment: s/p Procedure(s): HYSTERECTOMY VAGINAL (N/A) ANTERIOR (CYSTOCELE) AND POSTERIOR REPAIR (RECTOCELE) (N/A): stable and progressing well  Plan: Discharge home as long as tolerates po meds   LOS: 0 days    Anoop Hemmer W 06/14/2016, 8:20 AM

## 2017-06-21 ENCOUNTER — Other Ambulatory Visit: Payer: Self-pay | Admitting: Obstetrics and Gynecology

## 2017-06-21 DIAGNOSIS — E2839 Other primary ovarian failure: Secondary | ICD-10-CM

## 2017-07-12 ENCOUNTER — Ambulatory Visit
Admission: RE | Admit: 2017-07-12 | Discharge: 2017-07-12 | Disposition: A | Discharge status: Home / Self Care | Payer: Managed Care, Other (non HMO) | Source: Ambulatory Visit | Attending: Obstetrics and Gynecology | Admitting: Obstetrics and Gynecology

## 2017-07-12 DIAGNOSIS — E2839 Other primary ovarian failure: Secondary | ICD-10-CM

## 2017-09-28 NOTE — Progress Notes (Signed)
Need orders in epic for 4-1 surgery

## 2017-10-01 ENCOUNTER — Ambulatory Visit: Payer: Self-pay | Admitting: Orthopedic Surgery

## 2017-10-31 NOTE — Patient Instructions (Signed)
LUS KRIEGEL  10/31/2017   Your procedure is scheduled on: Monday 11/06/2017  Report to [facility] Main  Entrance              Report to admitting at   0905 AM   Call this number if you have problems the morning of surgery [phone]    Remember: Do not eat food or drink liquids :After Midnight.     Take these medicines the morning of surgery with A SIP OF WATER: use Flonase nasal spray if needed                                You may not have any metal on your body including hair pins and              piercings  Do not wear jewelry, make-up, lotions, powders or perfumes, deodorant             Do not wear nail polish.  Do not shave  48 hours prior to surgery.              Men may shave face and neck.   Do not bring valuables to the hospital. [place].  Contacts, dentures or bridgework may not be worn into surgery.  Leave suitcase in the car. After surgery it may be brought to your room.                Please read over the following fact sheets you were given: _____________________________________________________________________             [facility] - Preparing for Surgery Before surgery, you can play an important role.  Because skin is not sterile, your skin needs to be as free of germs as possible.  You can reduce the number of germs on your skin by washing with CHG (chlorahexidine gluconate) soap before surgery.  CHG is an antiseptic cleaner which kills germs and bonds with the skin to continue killing germs even after washing. Please DO NOT use if you have an allergy to CHG or antibacterial soaps.  If your skin becomes reddened/irritated stop using the CHG and inform your nurse when you arrive at Short Stay. Do not shave (including legs and underarms) for at least 48 hours prior to the first CHG shower.  You may shave your face/neck. Please follow these instructions carefully:  1.  Shower  with CHG Soap the night before surgery and the  morning of Surgery.  2.  If you choose to wash your hair, wash your hair first as usual with your  normal  shampoo.  3.  After you shampoo, rinse your hair and body thoroughly to remove the  shampoo.                           4.  Use CHG as you would any other liquid soap.  You can apply chg directly  to the skin and wash                       Gently with a scrungie or clean washcloth.  5.  Apply the CHG Soap to your body ONLY FROM THE NECK  DOWN.   Do not use on face/ open                           Wound or open sores. Avoid contact with eyes, ears mouth and genitals (private parts).                       Wash face,  Genitals (private parts) with your normal soap.             6.  Wash thoroughly, paying special attention to the area where your surgery  will be performed.  7.  Thoroughly rinse your body with warm water from the neck down.  8.  DO NOT shower/wash with your normal soap after using and rinsing off  the CHG Soap.                9.  Pat yourself dry with a clean towel.            10.  Wear clean pajamas.            11.  Place clean sheets on your bed the night of your first shower and do not  sleep with pets. Day of Surgery : Do not apply any lotions/deodorants the morning of surgery.  Please wear clean clothes to the hospital/surgery center.  FAILURE TO FOLLOW THESE INSTRUCTIONS MAY RESULT IN THE CANCELLATION OF YOUR SURGERY PATIENT SIGNATURE_________________________________  NURSE SIGNATURE__________________________________  ________________________________________________________________________   Adam Phenix  An incentive spirometer is a tool that can help keep your lungs clear and active. This tool measures how well you are filling your lungs with each breath. Taking long deep breaths may help reverse or decrease the chance of developing breathing (pulmonary) problems (especially infection) following:  A long period  of time when you are unable to move or be active. BEFORE THE PROCEDURE   If the spirometer includes an indicator to show your best effort, your nurse or respiratory therapist will set it to a desired goal.  If possible, sit up straight or lean slightly forward. Try not to slouch.  Hold the incentive spirometer in an upright position. INSTRUCTIONS FOR USE  1. Sit on the edge of your bed if possible, or sit up as far as you can in bed or on a chair. 2. Hold the incentive spirometer in an upright position. 3. Breathe out normally. 4. Place the mouthpiece in your mouth and seal your lips tightly around it. 5. Breathe in slowly and as deeply as possible, raising the piston or the ball toward the top of the column. 6. Hold your breath for 3-5 seconds or for as long as possible. Allow the piston or ball to fall to the bottom of the column. 7. Remove the mouthpiece from your mouth and breathe out normally. 8. Rest for a few seconds and repeat Steps 1 through 7 at least 10 times every 1-2 hours when you are awake. Take your time and take a few normal breaths between deep breaths. 9. The spirometer may include an indicator to show your best effort. Use the indicator as a goal to work toward during each repetition. 10. After each set of 10 deep breaths, practice coughing to be sure your lungs are clear. If you have an incision (the cut made at the time of surgery), support your incision when coughing by placing a pillow or rolled up towels firmly against it. Once you are able to  get out of bed, walk around indoors and cough well. You may stop using the incentive spirometer when instructed by your caregiver.  RISKS AND COMPLICATIONS  Take your time so you do not get dizzy or light-headed.  If you are in pain, you may need to take or ask for pain medication before doing incentive spirometry. It is harder to take a deep breath if you are having pain. AFTER USE  Rest and breathe slowly and easily.  It  can be helpful to keep track of a log of your progress. Your caregiver can provide you with a simple table to help with this. If you are using the spirometer at home, follow these instructions: [place] IF:   You are having difficultly using the spirometer.  You have trouble using the spirometer as often as instructed.  Your pain medication is not giving enough relief while using the spirometer.  You develop fever of 100.5 F (38.1 C) or higher. SEEK IMMEDIATE MEDICAL CARE IF:   You cough up bloody sputum that had not been present before.  You develop fever of 102 F (38.9 C) or greater.  You develop worsening pain at or near the incision site. MAKE SURE YOU:   Understand these instructions.  Will watch your condition.  Will get help right away if you are not doing well or get worse. Document Released: 12/05/2006 Document Revised: 10/17/2011 Document Reviewed: 02/05/2007 ExitCare Patient Information 2014 ExitCare, [place].   ________________________________________________________________________  WHAT IS A BLOOD TRANSFUSION? Blood Transfusion Information  A transfusion is the replacement of blood or some of its parts. Blood is made up of multiple cells which provide different functions.  Red blood cells carry oxygen and are used for blood loss replacement.  White blood cells fight against infection.  Platelets control bleeding.  Plasma helps clot blood.  Other blood products are available for specialized needs, such as hemophilia or other clotting disorders. BEFORE THE TRANSFUSION  Who gives blood for transfusions?   Healthy volunteers who are fully evaluated to make sure their blood is safe. This is blood bank blood. Transfusion therapy is the safest it has ever been in the practice of medicine. Before blood is taken from a donor, a complete history is taken to make sure that person has no history of diseases nor engages in risky social behavior (examples  are intravenous drug use or sexual activity with multiple partners). The donor's travel history is screened to minimize risk of transmitting infections, such as malaria. The donated blood is tested for signs of infectious diseases, such as HIV and hepatitis. The blood is then tested to be sure it is compatible with you in order to minimize the chance of a transfusion reaction. If you or a relative donates blood, this is often done in anticipation of surgery and is not appropriate for emergency situations. It takes many days to process the donated blood. RISKS AND COMPLICATIONS Although transfusion therapy is very safe and saves many lives, the main dangers of transfusion include:   Getting an infectious disease.  Developing a transfusion reaction. This is an allergic reaction to something in the blood you were given. Every precaution is taken to prevent this. The decision to have a blood transfusion has been considered carefully by your caregiver before blood is given. Blood is not given unless the benefits outweigh the risks. AFTER THE TRANSFUSION  Right after receiving a blood transfusion, you will usually feel much better and more energetic. This is especially true if  your red blood cells have gotten low (anemic). The transfusion raises the level of the red blood cells which carry oxygen, and this usually causes an energy increase.  The nurse administering the transfusion will monitor you carefully for complications. HOME CARE INSTRUCTIONS  No special instructions are needed after a transfusion. You may find your energy is better. Speak with your caregiver about any limitations on activity for underlying diseases you may have. SEEK MEDICAL CARE IF:   Your condition is not improving after your transfusion.  You develop redness or irritation at the intravenous (IV) site. SEEK IMMEDIATE MEDICAL CARE IF:  Any of the following symptoms occur over the next 12 hours:  Shaking chills.  You have a  temperature by mouth above 102 F (38.9 C), not controlled by medicine.  Chest, back, or muscle pain.  People around you feel you are not acting correctly or are confused.  Shortness of breath or difficulty breathing.  Dizziness and fainting.  You get a rash or develop hives.  You have a decrease in urine output.  Your urine turns a dark color or changes to pink, red, or brown. Any of the following symptoms occur over the next 10 days:  You have a temperature by mouth above 102 F (38.9 C), not controlled by medicine.  Shortness of breath.  Weakness after normal activity.  The white part of the eye turns yellow (jaundice).  You have a decrease in the amount of urine or are urinating less often.  Your urine turns a dark color or changes to pink, red, or brown. Document Released: 07/22/2000 Document Revised: 10/17/2011 Document Reviewed: 03/10/2008 [facility] Patient Information 2014 [place], [place].  _______________________________________________________________________

## 2017-11-01 ENCOUNTER — Other Ambulatory Visit: Payer: Self-pay

## 2017-11-01 ENCOUNTER — Encounter (HOSPITAL_COMMUNITY): Payer: Self-pay | Admitting: *Deleted

## 2017-11-01 ENCOUNTER — Encounter (HOSPITAL_COMMUNITY)
Admission: RE | Admit: 2017-11-01 | Discharge: 2017-11-01 | Disposition: A | Discharge status: Home / Self Care | Payer: Managed Care, Other (non HMO) | Source: Ambulatory Visit | Attending: Orthopedic Surgery | Admitting: Orthopedic Surgery

## 2017-11-01 DIAGNOSIS — M1711 Unilateral primary osteoarthritis, right knee: Secondary | ICD-10-CM | POA: Insufficient documentation

## 2017-11-01 DIAGNOSIS — Z01818 Encounter for other preprocedural examination: Secondary | ICD-10-CM | POA: Insufficient documentation

## 2017-11-01 HISTORY — DX: Gastro-esophageal reflux disease without esophagitis: K21.9

## 2017-11-01 LAB — CBC
HCT: 39.2 % (ref 36.0–46.0)
Hemoglobin: 13.5 g/dL (ref 12.0–15.0)
MCH: 29.6 pg (ref 26.0–34.0)
MCHC: 34.4 g/dL (ref 30.0–36.0)
MCV: 86 fL (ref 78.0–100.0)
PLATELETS: 286 10*3/uL (ref 150–400)
RBC: 4.56 MIL/uL (ref 3.87–5.11)
RDW: 12.8 % (ref 11.5–15.5)
WBC: 8.2 10*3/uL (ref 4.0–10.5)

## 2017-11-01 LAB — COMPREHENSIVE METABOLIC PANEL
ALBUMIN: 4.4 g/dL (ref 3.5–5.0)
ALT: 19 U/L (ref 14–54)
ANION GAP: 13 (ref 5–15)
AST: 25 U/L (ref 15–41)
Alkaline Phosphatase: 75 U/L (ref 38–126)
BUN: 14 mg/dL (ref 6–20)
CHLORIDE: 94 mmol/L — AB (ref 101–111)
CO2: 26 mmol/L (ref 22–32)
Calcium: 9.4 mg/dL (ref 8.9–10.3)
Creatinine, Ser: 0.8 mg/dL (ref 0.44–1.00)
GFR calc Af Amer: >60 mL/min (ref >60)
GFR calc non Af Amer: >60 mL/min (ref >60)
GLUCOSE: 96 mg/dL (ref 65–99)
POTASSIUM: 3.6 mmol/L (ref 3.5–5.1)
SODIUM: 133 mmol/L — AB (ref 135–145)
TOTAL PROTEIN: 7.5 g/dL (ref 6.5–8.1)
Total Bilirubin: 0.8 mg/dL (ref 0.3–1.2)

## 2017-11-01 LAB — PROTIME-INR
INR: 0.99
PROTHROMBIN TIME: 13 s (ref 11.4–15.2)

## 2017-11-01 LAB — APTT: aPTT: 29 seconds (ref 24–36)

## 2017-11-01 LAB — SURGICAL PCR SCREEN
MRSA, PCR: NEGATIVE
STAPHYLOCOCCUS AUREUS: NEGATIVE

## 2017-11-01 NOTE — Progress Notes (Signed)
10/17/2017- EKG from [facility] on chart.

## 2017-11-02 LAB — ABO/RH: ABO/RH(D): A POS

## 2017-11-03 NOTE — Progress Notes (Signed)
10/31/2017-Clearance note on chart from Dr. Jeralene Huff

## 2017-11-05 ENCOUNTER — Ambulatory Visit: Payer: Self-pay | Admitting: Orthopedic Surgery

## 2017-11-05 MED ORDER — BUPIVACAINE LIPOSOME 1.3 % IJ SUSP
20.0000 mL | Freq: Once | INTRAMUSCULAR | Status: DC
  Filled 2017-11-05: qty 20

## 2017-11-05 NOTE — H&P (View-Only) (Signed)
Heidi Reeves [phone], F)  DOB 1960/09/02    Chief Complaint Right Knee Pain H&P Right TKA 11/06/2017 at [place] Team Primary Care Provider: Jefm Petty MD: [facility], [place], [place] [address], Ph [phone], Fax [phone] NPI: [id number] Patient's Pharmacies [id number]): [id number], [place] [address], Ph [phone], Fax [phone]   Vitals Ht: 5 ft 6 in Wt: 205 lbs  BMI: 33.1    Allergies Reviewed Allergies NKDA   Medications Reviewed Medications fluticasone propionate 50 mcg/actuation nasal spray,suspension SPRAY 2 SPRAYS INTO EACH NOSTRIL EVERY DAY 08/20/17   filled surescripts meloxicam 15 mg tablet TAKE 1 TABLET BY MOUTH EVERY DAY 09/15/17   prescribed Gaynelle Arabian, MD Tylenol PM as needed before bed 08/18/17   entered [place] [facility]             Problems Reviewed Problems Osteoarthritis of knee Osteoarthritis of right hip joint    Family History Reviewed Family History Mother - Osteoarthritis Paternal Grandfather - Rheumatoid arthritis   - Heart disease Father - Hypertensive disorder Maternal Grandmother - Osteoporosis   - Cerebrovascular accident Maternal Grandfather - Family history of cancer   Social History Reviewed Social History Smoking Status: Never smoker Chewing tobacco: none Alcohol intake: Occasional Hand Dominance: Right Work related injury?: N Exercise level: Occasional Advance directive: Y Medical Power of Attorney: Y   Surgical History Reviewed Surgical History Oophorectomy Arthroscopy of knee Tonsillectomy Tubal ligation Hysterectomy - 08/09/2015   Past Medical History Reviewed Past Medical History Joint Pain: Y Osteoarthritis: Y Psoriasis: Y    HPI The patient is scheduled for a right TKA on 11/06/2017 by Dr. Wynelle Link at [facility]. The patient is being followed for her bilateral knee pain and osteoarthritis. She is almost  a year out from bilateral knee Synvisc series. Since the Synvisc, her left knee has been doing OK, but her right knee has been giving her trouble. Her knee pain is now constant. Current treatment includes Voltaren gel which so far does not seem to be helping much and also Meloxicam. She also takes a Tylenol PM at night. She has had progressively worsening pain of the RIGHT knee to the point where it is bothering her at all times. It is now limiting what she can and cannot do. Cortisone and viscous supplement injections have not provided any lasting benefit. AP and lateral of both knees show severe end-stage arthritis of the RIGHT knee with bone-on-bone medial and patellofemoral and large osteophyte formation. There is also degenerative change in the LEFT knee but to a lesser degree. She is at a stage where the knee is essentially taking over her life. She would like to be more active but the knee is preventing her from doing so. She has reached a point where she would like to get the knee fixed. Risks and benefits of the procedure have been discussed with the patient and she elects to proceed with surgery at this time.  ROS Constitutional: Constitutional: no fever, chills, night sweats, or significant weight loss.  Cardiovascular: Cardiovascular: no palpitations or chest pain.  Gastrointestinal: Gastrointestinal: no vomiting or nausea.  Musculoskeletal: Musculoskeletal: Joint Pain and back pain; stiffness, decreased range of motion.  Neurologic: Neurologic: no numbness, tingling, or difficulty with balance.   Physical Exam Patient is a 57 year old female.  General Mental Status - Alert, cooperative and good historian. General Appearance - pleasant, Not in acute distress. Orientation - Oriented X3. Build & Nutrition -  Well nourished and Well developed.  Head and Neck Head - normocephalic, atraumatic . Neck Global Assessment - supple, no bruit auscultated on the right, no bruit auscultated  on the left.  Eye Pupil - Bilateral - PERR Motion - Bilateral - EOMI.  Chest and Lung Exam Auscultation Breath sounds - clear at anterior chest wall and clear at posterior chest wall. Adventitious sounds - No Adventitious sounds.  Cardiovascular Auscultation Rhythm - Regular rate and rhythm. Heart Sounds - S1 WNL and S2 WNL. Murmurs & Other Heart Sounds - Auscultation of the heart reveals - No Murmurs.  Abdomen Palpation/Percussion Tenderness - Abdomen is non-tender to palpation. Abdomen is soft. Auscultation Auscultation of the abdomen reveals - Bowel sounds normal.  Genitourinary Note: Not done, not pertinent to present illness  Musculoskeletal Her hip show normal range of motion without discomfort. She has slight tenderness over the RIGHT greater trochanter. Her RIGHT knee shows varus deformity. There is no effusion. Her range of motion is 10-120. There is marked crepitus on range of motion. She is tender medial greater than lateral with no instability. Her LEFT knee shows no effusion. Range of motion is 5-125. There is no tenderness laterally but there is tenderness medially. There is no instability. Gait pattern is significantly antalgic on the RIGHT. Pulses sensation and motor are intact both lower extremities.  Radiographs-AP and lateral of both knees show severe end-stage arthritis of the RIGHT knee with bone-on-bone medial and patellofemoral and large osteophyte formation. There is also degenerative change in the LEFT knee but to a lesser degree.    Assessment / Plan 1. Osteoarthritis of knee M17.11: Unilateral primary osteoarthritis, right knee  Patient Instructions Surgical Plans: Right Total Knee Replacement Disposition: Home, Straight to Outpatient Therapy - Setup with [facility] Therapy PCP: Dr. Jeralene Huff - pending  IV TXA Anesthesia Issues: None Patient was instructed on what medications to stop prior to surgery. - Follow up visit in 2 weeks with Dr. Wynelle Link -  Begin physical therapy following surgery. PHYSICAL THERAPY REFERRAL to setup first PT eval visit on Friday 11/10/2017 - Right Total Knee Arthroplasty - Pre-operative lab work as pre Pre-Surgical Testing - Prescriptions will be provided in hospital at time of discharge  Return to Office Gaynelle Arabian, MD for [facility] on 11/21/2017 at 02:00 PM  Encounter signed-off by Mickel Crow, PA-C

## 2017-11-05 NOTE — H&P (Signed)
Heidi Reeves [id number], F)  DOB 1960-08-18    Chief Complaint Right Knee Pain H&P Right TKA 11/06/2017 at [place] Team Primary Care Provider: Jefm Petty MD: [place], [place], [place] [address], Ph [phone], Fax [phone] NPI: [id number] Patient's Pharmacies [id number]): [id number], [place] [address], Ph [phone], Fax [phone]   Vitals Ht: 5 ft 6 in Wt: 205 lbs  BMI: 33.1    Allergies Reviewed Allergies NKDA   Medications Reviewed Medications fluticasone propionate 50 mcg/actuation nasal spray,suspension SPRAY 2 SPRAYS INTO EACH NOSTRIL EVERY DAY 08/20/17   filled surescripts meloxicam 15 mg tablet TAKE 1 TABLET BY MOUTH EVERY DAY 09/15/17   prescribed Gaynelle Arabian, MD Tylenol PM as needed before bed 08/18/17   entered [place] [facility]             Problems Reviewed Problems Osteoarthritis of knee Osteoarthritis of right hip joint    Family History Reviewed Family History Mother - Osteoarthritis Paternal Grandfather - Rheumatoid arthritis   - Heart disease Father - Hypertensive disorder Maternal Grandmother - Osteoporosis   - Cerebrovascular accident Maternal Grandfather - Family history of cancer   Social History Reviewed Social History Smoking Status: Never smoker Chewing tobacco: none Alcohol intake: Occasional Hand Dominance: Right Work related injury?: N Exercise level: Occasional Advance directive: Y Medical Power of Attorney: Y   Surgical History Reviewed Surgical History Oophorectomy Arthroscopy of knee Tonsillectomy Tubal ligation Hysterectomy - 08/09/2015   Past Medical History Reviewed Past Medical History Joint Pain: Y Osteoarthritis: Y Psoriasis: Y    HPI The patient is scheduled for a right TKA on 11/06/2017 by Dr. Wynelle Link at [facility]. The patient is being followed for her bilateral knee pain and osteoarthritis. She is almost  a year out from bilateral knee Synvisc series. Since the Synvisc, her left knee has been doing OK, but her right knee has been giving her trouble. Her knee pain is now constant. Current treatment includes Voltaren gel which so far does not seem to be helping much and also Meloxicam. She also takes a Tylenol PM at night. She has had progressively worsening pain of the RIGHT knee to the point where it is bothering her at all times. It is now limiting what she can and cannot do. Cortisone and viscous supplement injections have not provided any lasting benefit. AP and lateral of both knees show severe end-stage arthritis of the RIGHT knee with bone-on-bone medial and patellofemoral and large osteophyte formation. There is also degenerative change in the LEFT knee but to a lesser degree. She is at a stage where the knee is essentially taking over her life. She would like to be more active but the knee is preventing her from doing so. She has reached a point where she would like to get the knee fixed. Risks and benefits of the procedure have been discussed with the patient and she elects to proceed with surgery at this time.  ROS Constitutional: Constitutional: no fever, chills, night sweats, or significant weight loss.  Cardiovascular: Cardiovascular: no palpitations or chest pain.  Gastrointestinal: Gastrointestinal: no vomiting or nausea.  Musculoskeletal: Musculoskeletal: Joint Pain and back pain; stiffness, decreased range of motion.  Neurologic: Neurologic: no numbness, tingling, or difficulty with balance.   Physical Exam Patient is a 57 year old female.  General Mental Status - Alert, cooperative and good historian. General Appearance - pleasant, Not in acute distress. Orientation - Oriented X3. Build & Nutrition -  Well nourished and Well developed.  Head and Neck Head - normocephalic, atraumatic . Neck Global Assessment - supple, no bruit auscultated on the right, no bruit auscultated  on the left.  Eye Pupil - Bilateral - PERR Motion - Bilateral - EOMI.  Chest and Lung Exam Auscultation Breath sounds - clear at anterior chest wall and clear at posterior chest wall. Adventitious sounds - No Adventitious sounds.  Cardiovascular Auscultation Rhythm - Regular rate and rhythm. Heart Sounds - S1 WNL and S2 WNL. Murmurs & Other Heart Sounds - Auscultation of the heart reveals - No Murmurs.  Abdomen Palpation/Percussion Tenderness - Abdomen is non-tender to palpation. Abdomen is soft. Auscultation Auscultation of the abdomen reveals - Bowel sounds normal.  Genitourinary Note: Not done, not pertinent to present illness  Musculoskeletal Her hip show normal range of motion without discomfort. She has slight tenderness over the RIGHT greater trochanter. Her RIGHT knee shows varus deformity. There is no effusion. Her range of motion is 10-120. There is marked crepitus on range of motion. She is tender medial greater than lateral with no instability. Her LEFT knee shows no effusion. Range of motion is 5-125. There is no tenderness laterally but there is tenderness medially. There is no instability. Gait pattern is significantly antalgic on the RIGHT. Pulses sensation and motor are intact both lower extremities.  Radiographs-AP and lateral of both knees show severe end-stage arthritis of the RIGHT knee with bone-on-bone medial and patellofemoral and large osteophyte formation. There is also degenerative change in the LEFT knee but to a lesser degree.    Assessment / Plan 1. Osteoarthritis of knee M17.11: Unilateral primary osteoarthritis, right knee  Patient Instructions Surgical Plans: Right Total Knee Replacement Disposition: Home, Straight to Outpatient Therapy - Setup with [facility] Therapy PCP: Dr. Jeralene Huff - pending  IV TXA Anesthesia Issues: None Patient was instructed on what medications to stop prior to surgery. - Follow up visit in 2 weeks with Dr. Wynelle Link -  Begin physical therapy following surgery. PHYSICAL THERAPY REFERRAL to setup first PT eval visit on Friday 11/10/2017 - Right Total Knee Arthroplasty - Pre-operative lab work as pre Pre-Surgical Testing - Prescriptions will be provided in hospital at time of discharge  Return to Office Gaynelle Arabian, MD for [facility] on 11/21/2017 at 02:00 PM  Encounter signed-off by Mickel Crow, PA-C

## 2017-11-05 NOTE — Anesthesia Preprocedure Evaluation (Addendum)
Anesthesia Evaluation  Patient identified by MRN, date of birth, ID band Patient awake    Reviewed: Allergy & Precautions, H&P , NPO status , Patient's Chart, lab work & pertinent test results  Airway Mallampati: II  TM Distance: >3 FB Neck ROM: Full    Dental no notable dental hx.    Pulmonary neg pulmonary ROS,    Pulmonary exam normal breath sounds clear to auscultation       Cardiovascular Exercise Tolerance: Good negative cardio ROS Normal cardiovascular exam Rhythm:Regular Rate:Normal     Neuro/Psych negative neurological ROS  negative psych ROS   GI/Hepatic Neg liver ROS, GERD  ,  Endo/Other  negative endocrine ROS  Renal/GU negative Renal ROS     Musculoskeletal  (+) Arthritis , Osteoarthritis,    Abdominal (+) + obese,   Peds  Hematology negative hematology ROS (+)   Anesthesia Other Findings   Reproductive/Obstetrics                            Lab Results  Component Value Date   WBC 8.2 11/01/2017   HGB 13.5 11/01/2017   HCT 39.2 11/01/2017   MCV 86.0 11/01/2017   PLT 286 11/01/2017    Anesthesia Physical Anesthesia Plan  ASA: II  Anesthesia Plan: Regional and Spinal   Post-op Pain Management:  Regional for Post-op pain   Induction:   PONV Risk Score and Plan: Treatment may vary due to age or medical condition  Airway Management Planned: Mask, Natural Airway and Nasal Cannula  Additional Equipment:   Intra-op Plan:   Post-operative Plan:   Informed Consent: I have reviewed the patients History and Physical, chart, labs and discussed the procedure including the risks, benefits and alternatives for the proposed anesthesia with the patient or authorized representative who has indicated his/her understanding and acceptance.     Plan Discussed with: CRNA  Anesthesia Plan Comments:        Anesthesia Quick Evaluation

## 2017-11-06 ENCOUNTER — Encounter (HOSPITAL_COMMUNITY): Payer: Self-pay

## 2017-11-06 ENCOUNTER — Inpatient Hospital Stay (HOSPITAL_COMMUNITY): Payer: Managed Care, Other (non HMO) | Admitting: Anesthesiology

## 2017-11-06 ENCOUNTER — Encounter (HOSPITAL_COMMUNITY)
Admission: RE | Disposition: A | Discharge status: Home / Self Care | Payer: Self-pay | Source: Ambulatory Visit | Attending: Orthopedic Surgery

## 2017-11-06 ENCOUNTER — Inpatient Hospital Stay (HOSPITAL_COMMUNITY)
Admission: RE | Admit: 2017-11-06 | Discharge: 2017-11-08 | DRG: 470 | Disposition: A | Discharge status: Home / Self Care | Payer: Managed Care, Other (non HMO) | Source: Ambulatory Visit | Attending: Orthopedic Surgery | Admitting: Orthopedic Surgery

## 2017-11-06 ENCOUNTER — Other Ambulatory Visit: Payer: Self-pay

## 2017-11-06 DIAGNOSIS — E669 Obesity, unspecified: Secondary | ICD-10-CM | POA: Diagnosis present

## 2017-11-06 DIAGNOSIS — Z8261 Family history of arthritis: Secondary | ICD-10-CM

## 2017-11-06 DIAGNOSIS — Z7951 Long term (current) use of inhaled steroids: Secondary | ICD-10-CM

## 2017-11-06 DIAGNOSIS — M179 Osteoarthritis of knee, unspecified: Secondary | ICD-10-CM | POA: Diagnosis present

## 2017-11-06 DIAGNOSIS — M1611 Unilateral primary osteoarthritis, right hip: Secondary | ICD-10-CM | POA: Diagnosis present

## 2017-11-06 DIAGNOSIS — R509 Fever, unspecified: Secondary | ICD-10-CM

## 2017-11-06 DIAGNOSIS — Z791 Long term (current) use of non-steroidal anti-inflammatories (NSAID): Secondary | ICD-10-CM | POA: Diagnosis not present

## 2017-11-06 DIAGNOSIS — M1711 Unilateral primary osteoarthritis, right knee: Secondary | ICD-10-CM | POA: Diagnosis present

## 2017-11-06 DIAGNOSIS — Z6833 Body mass index (BMI) 33.0-33.9, adult: Secondary | ICD-10-CM | POA: Diagnosis not present

## 2017-11-06 DIAGNOSIS — M25561 Pain in right knee: Secondary | ICD-10-CM | POA: Diagnosis present

## 2017-11-06 DIAGNOSIS — M1710 Unilateral primary osteoarthritis, unspecified knee: Secondary | ICD-10-CM

## 2017-11-06 HISTORY — PX: TOTAL KNEE ARTHROPLASTY: SHX125

## 2017-11-06 LAB — TYPE AND SCREEN
ABO/RH(D): A POS
ANTIBODY SCREEN: NEGATIVE

## 2017-11-06 SURGERY — ARTHROPLASTY, KNEE, TOTAL
Anesthesia: Regional | Site: Knee | Laterality: Right

## 2017-11-06 MED ORDER — PROPOFOL 10 MG/ML IV BOLUS
INTRAVENOUS | Status: AC
  Filled 2017-11-06: qty 20

## 2017-11-06 MED ORDER — FLUTICASONE PROPIONATE 50 MCG/ACT NA SUSP
1.0000 | Freq: Every day | NASAL | Status: DC | PRN
  Administered 2017-11-08: 1 via NASAL
  Filled 2017-11-06: qty 16

## 2017-11-06 MED ORDER — TRANEXAMIC ACID 1000 MG/10ML IV SOLN
1000.0000 mg | Freq: Once | INTRAVENOUS | Status: AC
  Administered 2017-11-06: 1000 mg via INTRAVENOUS
  Filled 2017-11-06: qty 10

## 2017-11-06 MED ORDER — MEPERIDINE HCL 50 MG/ML IJ SOLN: 6.2500 mg | INTRAMUSCULAR | Status: DC | PRN

## 2017-11-06 MED ORDER — CEFAZOLIN SODIUM-DEXTROSE 2-4 GM/100ML-% IV SOLN
2.0000 g | INTRAVENOUS | Status: AC
  Administered 2017-11-06: 2 g via INTRAVENOUS
  Filled 2017-11-06: qty 100

## 2017-11-06 MED ORDER — METHOCARBAMOL 500 MG PO TABS
500.0000 mg | ORAL_TABLET | Freq: Four times a day (QID) | ORAL | Status: DC | PRN
  Administered 2017-11-06 – 2017-11-08 (×6): 500 mg via ORAL
  Filled 2017-11-06 (×6): qty 1

## 2017-11-06 MED ORDER — METHOCARBAMOL 1000 MG/10ML IJ SOLN
500.0000 mg | Freq: Four times a day (QID) | INTRAVENOUS | Status: DC | PRN
  Administered 2017-11-06: 500 mg via INTRAVENOUS
  Filled 2017-11-06: qty 550

## 2017-11-06 MED ORDER — TRANEXAMIC ACID 1000 MG/10ML IV SOLN
1000.0000 mg | INTRAVENOUS | Status: AC
  Administered 2017-11-06: 1000 mg via INTRAVENOUS
  Filled 2017-11-06: qty 1100

## 2017-11-06 MED ORDER — LACTATED RINGERS IV SOLN
INTRAVENOUS | Status: DC
  Administered 2017-11-06: 12:00:00 via INTRAVENOUS
  Administered 2017-11-06: 1000 mL via INTRAVENOUS

## 2017-11-06 MED ORDER — SODIUM CHLORIDE 0.9 % IJ SOLN
INTRAMUSCULAR | Status: DC | PRN
  Administered 2017-11-06 (×2): 30 mL

## 2017-11-06 MED ORDER — STERILE WATER FOR IRRIGATION IR SOLN
Status: DC | PRN
  Administered 2017-11-06: 2000 mL

## 2017-11-06 MED ORDER — FENTANYL CITRATE (PF) 100 MCG/2ML IJ SOLN
INTRAMUSCULAR | Status: AC
  Filled 2017-11-06: qty 2

## 2017-11-06 MED ORDER — FENTANYL CITRATE (PF) 100 MCG/2ML IJ SOLN
50.0000 ug | INTRAMUSCULAR | Status: DC
  Administered 2017-11-06: 100 ug via INTRAVENOUS
  Filled 2017-11-06: qty 2

## 2017-11-06 MED ORDER — SODIUM CHLORIDE 0.9 % IV SOLN
INTRAVENOUS | Status: DC
  Administered 2017-11-06: 16:00:00 via INTRAVENOUS

## 2017-11-06 MED ORDER — FLEET ENEMA 7-19 GM/118ML RE ENEM
1.0000 | ENEMA | Freq: Once | RECTAL | Status: DC | PRN
Start: 2017-11-06 — End: 2017-11-08

## 2017-11-06 MED ORDER — OXYCODONE HCL 5 MG PO TABS
10.0000 mg | ORAL_TABLET | ORAL | Status: DC | PRN
  Administered 2017-11-06 – 2017-11-08 (×8): 15 mg via ORAL
  Filled 2017-11-06 (×8): qty 3

## 2017-11-06 MED ORDER — MENTHOL 3 MG MT LOZG: 1.0000 | LOZENGE | OROMUCOSAL | Status: DC | PRN

## 2017-11-06 MED ORDER — ONDANSETRON HCL 4 MG/2ML IJ SOLN
INTRAMUSCULAR | Status: AC
  Filled 2017-11-06: qty 2

## 2017-11-06 MED ORDER — PROPOFOL 10 MG/ML IV BOLUS
INTRAVENOUS | Status: AC
  Filled 2017-11-06: qty 60

## 2017-11-06 MED ORDER — ONDANSETRON HCL 4 MG PO TABS: 4.0000 mg | ORAL_TABLET | Freq: Four times a day (QID) | ORAL | Status: DC | PRN

## 2017-11-06 MED ORDER — PHENOL 1.4 % MT LIQD: 1.0000 | OROMUCOSAL | Status: DC | PRN

## 2017-11-06 MED ORDER — PROPOFOL 500 MG/50ML IV EMUL
INTRAVENOUS | Status: DC | PRN
  Administered 2017-11-06: 75 ug/kg/min via INTRAVENOUS

## 2017-11-06 MED ORDER — HYDROCODONE-ACETAMINOPHEN 7.5-325 MG PO TABS: 1.0000 | ORAL_TABLET | Freq: Once | ORAL | Status: DC | PRN

## 2017-11-06 MED ORDER — CHLORHEXIDINE GLUCONATE 4 % EX LIQD: 60.0000 mL | Freq: Once | CUTANEOUS | Status: DC

## 2017-11-06 MED ORDER — METOCLOPRAMIDE HCL 5 MG PO TABS: 5.0000 mg | ORAL_TABLET | Freq: Three times a day (TID) | ORAL | Status: DC | PRN

## 2017-11-06 MED ORDER — METOCLOPRAMIDE HCL 5 MG/ML IJ SOLN: 5.0000 mg | Freq: Three times a day (TID) | INTRAMUSCULAR | Status: DC | PRN

## 2017-11-06 MED ORDER — HYDROMORPHONE HCL 1 MG/ML IJ SOLN: 0.2500 mg | INTRAMUSCULAR | Status: DC | PRN

## 2017-11-06 MED ORDER — BUPIVACAINE LIPOSOME 1.3 % IJ SUSP
INTRAMUSCULAR | Status: DC | PRN
  Administered 2017-11-06 (×2): 10 mL

## 2017-11-06 MED ORDER — OXYCODONE HCL 5 MG PO TABS
5.0000 mg | ORAL_TABLET | ORAL | Status: DC | PRN
  Administered 2017-11-06 – 2017-11-08 (×4): 10 mg via ORAL
  Filled 2017-11-06 (×4): qty 2

## 2017-11-06 MED ORDER — ONDANSETRON HCL 4 MG/2ML IJ SOLN
INTRAMUSCULAR | Status: DC | PRN
  Administered 2017-11-06: 4 mg via INTRAVENOUS

## 2017-11-06 MED ORDER — POLYETHYLENE GLYCOL 3350 17 G PO PACK
17.0000 g | PACK | Freq: Every day | ORAL | Status: DC | PRN
  Administered 2017-11-07: 17 g via ORAL
  Filled 2017-11-06: qty 1

## 2017-11-06 MED ORDER — BISACODYL 10 MG RE SUPP: 10.0000 mg | Freq: Every day | RECTAL | Status: DC | PRN

## 2017-11-06 MED ORDER — ACETAMINOPHEN 500 MG PO TABS
1000.0000 mg | ORAL_TABLET | Freq: Four times a day (QID) | ORAL | Status: AC
  Administered 2017-11-06 – 2017-11-07 (×3): 1000 mg via ORAL
  Filled 2017-11-06 (×4): qty 2

## 2017-11-06 MED ORDER — CEFAZOLIN SODIUM-DEXTROSE 2-4 GM/100ML-% IV SOLN
2.0000 g | Freq: Four times a day (QID) | INTRAVENOUS | Status: AC
  Administered 2017-11-06 (×2): 2 g via INTRAVENOUS
  Filled 2017-11-06 (×2): qty 100

## 2017-11-06 MED ORDER — HYDROMORPHONE HCL 1 MG/ML IJ SOLN
0.5000 mg | INTRAMUSCULAR | Status: DC | PRN
Start: 2017-11-06 — End: 2017-11-08
  Administered 2017-11-06: 1 mg via INTRAVENOUS
  Filled 2017-11-06: qty 1

## 2017-11-06 MED ORDER — SODIUM CHLORIDE 0.9 % IJ SOLN
INTRAMUSCULAR | Status: AC
  Filled 2017-11-06: qty 10

## 2017-11-06 MED ORDER — PROPOFOL 10 MG/ML IV BOLUS
INTRAVENOUS | Status: DC | PRN
  Administered 2017-11-06: 80 mg via INTRAVENOUS

## 2017-11-06 MED ORDER — SODIUM CHLORIDE 0.9 % IJ SOLN
INTRAMUSCULAR | Status: AC
  Filled 2017-11-06: qty 50

## 2017-11-06 MED ORDER — ACETAMINOPHEN 10 MG/ML IV SOLN
1000.0000 mg | Freq: Once | INTRAVENOUS | Status: DC | PRN
  Administered 2017-11-06: 1000 mg via INTRAVENOUS

## 2017-11-06 MED ORDER — GABAPENTIN 300 MG PO CAPS
300.0000 mg | ORAL_CAPSULE | Freq: Once | ORAL | Status: AC
  Administered 2017-11-06: 300 mg via ORAL
  Filled 2017-11-06: qty 1

## 2017-11-06 MED ORDER — FENTANYL CITRATE (PF) 100 MCG/2ML IJ SOLN
INTRAMUSCULAR | Status: DC | PRN
  Administered 2017-11-06 (×2): 50 ug via INTRAVENOUS

## 2017-11-06 MED ORDER — GABAPENTIN 300 MG PO CAPS
300.0000 mg | ORAL_CAPSULE | Freq: Three times a day (TID) | ORAL | Status: DC
  Administered 2017-11-06 – 2017-11-08 (×7): 300 mg via ORAL
  Filled 2017-11-06 (×7): qty 1

## 2017-11-06 MED ORDER — ROPIVACAINE HCL 7.5 MG/ML IJ SOLN
INTRAMUSCULAR | Status: DC | PRN
  Administered 2017-11-06: 20 mL via PERINEURAL

## 2017-11-06 MED ORDER — MIDAZOLAM HCL 2 MG/2ML IJ SOLN
1.0000 mg | INTRAMUSCULAR | Status: DC
  Administered 2017-11-06: 2 mg via INTRAVENOUS
  Filled 2017-11-06: qty 2

## 2017-11-06 MED ORDER — DEXAMETHASONE SODIUM PHOSPHATE 10 MG/ML IJ SOLN
10.0000 mg | Freq: Once | INTRAMUSCULAR | Status: AC
  Administered 2017-11-07: 10 mg via INTRAVENOUS
  Filled 2017-11-06: qty 1

## 2017-11-06 MED ORDER — BUPIVACAINE IN DEXTROSE 0.75-8.25 % IT SOLN
INTRATHECAL | Status: DC | PRN
  Administered 2017-11-06: 2 mL via INTRATHECAL

## 2017-11-06 MED ORDER — ACETAMINOPHEN 10 MG/ML IV SOLN
1000.0000 mg | Freq: Once | INTRAVENOUS | Status: AC
  Administered 2017-11-06: 1000 mg via INTRAVENOUS
  Filled 2017-11-06: qty 100

## 2017-11-06 MED ORDER — PROMETHAZINE HCL 25 MG/ML IJ SOLN: 6.2500 mg | INTRAMUSCULAR | Status: DC | PRN

## 2017-11-06 MED ORDER — ACETAMINOPHEN 10 MG/ML IV SOLN
INTRAVENOUS | Status: AC
  Filled 2017-11-06: qty 100

## 2017-11-06 MED ORDER — DIPHENHYDRAMINE HCL 12.5 MG/5ML PO ELIX: 12.5000 mg | ORAL_SOLUTION | ORAL | Status: DC | PRN

## 2017-11-06 MED ORDER — DEXAMETHASONE SODIUM PHOSPHATE 10 MG/ML IJ SOLN
10.0000 mg | Freq: Once | INTRAMUSCULAR | Status: AC
  Administered 2017-11-06: 10 mg via INTRAVENOUS

## 2017-11-06 MED ORDER — ONDANSETRON HCL 4 MG/2ML IJ SOLN: 4.0000 mg | Freq: Four times a day (QID) | INTRAMUSCULAR | Status: DC | PRN

## 2017-11-06 MED ORDER — 0.9 % SODIUM CHLORIDE (POUR BTL) OPTIME
TOPICAL | Status: DC | PRN
  Administered 2017-11-06: 1000 mL

## 2017-11-06 MED ORDER — RIVAROXABAN 10 MG PO TABS
10.0000 mg | ORAL_TABLET | Freq: Every day | ORAL | Status: DC
  Administered 2017-11-07 – 2017-11-08 (×2): 10 mg via ORAL
  Filled 2017-11-06 (×2): qty 1

## 2017-11-06 MED ORDER — DOCUSATE SODIUM 100 MG PO CAPS
100.0000 mg | ORAL_CAPSULE | Freq: Two times a day (BID) | ORAL | Status: DC
  Administered 2017-11-06 – 2017-11-08 (×4): 100 mg via ORAL
  Filled 2017-11-06 (×4): qty 1

## 2017-11-06 MED ORDER — SODIUM CHLORIDE 0.9 % IR SOLN
Status: DC | PRN
  Administered 2017-11-06: 1000 mL

## 2017-11-06 MED ORDER — LIDOCAINE 2% (20 MG/ML) 5 ML SYRINGE
INTRAMUSCULAR | Status: AC
  Filled 2017-11-06: qty 5

## 2017-11-06 MED ORDER — HYDROCHLOROTHIAZIDE 12.5 MG PO CAPS
12.5000 mg | ORAL_CAPSULE | Freq: Every day | ORAL | Status: DC
  Administered 2017-11-08: 12.5 mg via ORAL
  Filled 2017-11-06 (×2): qty 1

## 2017-11-06 MED ORDER — DEXAMETHASONE SODIUM PHOSPHATE 10 MG/ML IJ SOLN
INTRAMUSCULAR | Status: AC
  Filled 2017-11-06: qty 1

## 2017-11-06 SURGICAL SUPPLY — 50 items
BAG ZIPLOCK 12X15 (MISCELLANEOUS) ×2 IMPLANT
BANDAGE ACE 6X5 VEL STRL LF (GAUZE/BANDAGES/DRESSINGS) ×2 IMPLANT
BLADE SAG 18X100X1.27 (BLADE) ×2 IMPLANT
BLADE SAW SGTL 11.0X1.19X90.0M (BLADE) ×2 IMPLANT
BOWL SMART MIX CTS (DISPOSABLE) ×2 IMPLANT
CAPT KNEE TOTAL 3 ATTUNE ×2 IMPLANT
CEMENT HV SMART SET (Cement) ×4 IMPLANT
COVER SURGICAL LIGHT HANDLE (MISCELLANEOUS) ×2 IMPLANT
CUFF TOURN SGL QUICK 34 (TOURNIQUET CUFF) ×1
CUFF TRNQT CYL 34X4X40X1 (TOURNIQUET CUFF) ×1 IMPLANT
DECANTER SPIKE VIAL GLASS SM (MISCELLANEOUS) ×2 IMPLANT
DRAPE TOP 10253 STERILE (DRAPES) IMPLANT
DRAPE U-SHAPE 47X51 STRL (DRAPES) ×2 IMPLANT
DRSG ADAPTIC 3X8 NADH LF (GAUZE/BANDAGES/DRESSINGS) ×2 IMPLANT
DURAPREP 26ML APPLICATOR (WOUND CARE) ×2 IMPLANT
ELECT REM PT RETURN 15FT ADLT (MISCELLANEOUS) ×2 IMPLANT
EVACUATOR 1/8 PVC DRAIN (DRAIN) ×2 IMPLANT
GAUZE SPONGE 4X4 12PLY STRL (GAUZE/BANDAGES/DRESSINGS) ×2 IMPLANT
GLOVE BIO SURGEON STRL SZ 6.5 (GLOVE) ×2 IMPLANT
GLOVE BIO SURGEON STRL SZ8 (GLOVE) ×2 IMPLANT
GLOVE BIOGEL PI IND STRL 6.5 (GLOVE) ×2 IMPLANT
GLOVE BIOGEL PI IND STRL 7.5 (GLOVE) ×2 IMPLANT
GLOVE BIOGEL PI IND STRL 8 (GLOVE) ×2 IMPLANT
GLOVE BIOGEL PI INDICATOR 6.5 (GLOVE) ×2
GLOVE BIOGEL PI INDICATOR 7.5 (GLOVE) ×2
GLOVE BIOGEL PI INDICATOR 8 (GLOVE) ×2
GLOVE ECLIPSE 7.5 STRL STRAW (GLOVE) ×2 IMPLANT
GLOVE INDICATOR 6.5 STRL GRN (GLOVE) ×2 IMPLANT
GLOVE SURG SS PI 6.5 STRL IVOR (GLOVE) ×2 IMPLANT
GOWN STRL REUS W/TWL LRG LVL3 (GOWN DISPOSABLE) ×6 IMPLANT
GOWN STRL REUS W/TWL XL LVL3 (GOWN DISPOSABLE) ×2 IMPLANT
HANDPIECE INTERPULSE COAX TIP (DISPOSABLE) ×1
IMMOBILIZER KNEE 20 (SOFTGOODS) ×2
IMMOBILIZER KNEE 20 THIGH 36 (SOFTGOODS) ×1 IMPLANT
MANIFOLD NEPTUNE II (INSTRUMENTS) ×2 IMPLANT
PACK TOTAL KNEE CUSTOM (KITS) ×2 IMPLANT
PAD ABD 8X10 STRL (GAUZE/BANDAGES/DRESSINGS) ×2 IMPLANT
PADDING CAST COTTON 6X4 STRL (CAST SUPPLIES) ×6 IMPLANT
POSITIONER SURGICAL ARM (MISCELLANEOUS) ×2 IMPLANT
SET HNDPC FAN SPRY TIP SCT (DISPOSABLE) ×1 IMPLANT
STRIP CLOSURE SKIN 1/2X4 (GAUZE/BANDAGES/DRESSINGS) ×4 IMPLANT
SUT MNCRL AB 4-0 PS2 18 (SUTURE) ×2 IMPLANT
SUT STRATAFIX 0 PDS 27 VIOLET (SUTURE) ×2
SUT VIC AB 2-0 CT1 27 (SUTURE) ×3
SUT VIC AB 2-0 CT1 TAPERPNT 27 (SUTURE) ×3 IMPLANT
SUTURE STRATFX 0 PDS 27 VIOLET (SUTURE) ×1 IMPLANT
SYR 30ML LL (SYRINGE) ×2 IMPLANT
TRAY FOLEY CATH SILVER 14FR (SET/KITS/TRAYS/PACK) ×2 IMPLANT
WRAP KNEE MAXI GEL POST OP (GAUZE/BANDAGES/DRESSINGS) ×2 IMPLANT
YANKAUER SUCT BULB TIP 10FT TU (MISCELLANEOUS) ×2 IMPLANT

## 2017-11-06 NOTE — Progress Notes (Signed)
Assisted Dr. Valma Cava with right, ultrasound guided, knee, adductor canal block. Side rails up, monitors on throughout procedure. See vital signs in flow sheet. Tolerated Procedure well.

## 2017-11-06 NOTE — Interval H&P Note (Signed)
History and Physical Interval Note:  11/06/2017 9:20 AM  Heidi Reeves  has presented today for surgery, with the diagnosis of Osteoarthritis Right knee  The various methods of treatment have been discussed with the patient and family. After consideration of risks, benefits and other options for treatment, the patient has consented to  Procedure(s): RIGHT TOTAL KNEE ARTHROPLASTY (Right) as a surgical intervention .  The patient's history has been reviewed, patient examined, no change in status, stable for surgery.  I have reviewed the patient's chart and labs.  Questions were answered to the patient's satisfaction.     Heidi Reeves

## 2017-11-06 NOTE — Anesthesia Procedure Notes (Signed)
Date/Time: 11/06/2017 11:31 AM Performed by: Talbot Grumbling, CRNA Oxygen Delivery Method: Simple face mask

## 2017-11-06 NOTE — Anesthesia Procedure Notes (Signed)
Anesthesia Regional Block: Adductor canal block   Pre-Anesthetic Checklist: ,, timeout performed, Correct Patient, Correct Site, Correct Laterality, Correct Procedure, Correct Position, site marked, Risks and benefits discussed,  Surgical consent,  Pre-op evaluation,  At surgeon's request and post-op pain management  Laterality: Right  Prep: chloraprep       Needles:  Injection technique: Single-shot  Needle Type: Echogenic Needle     Needle Length: 9cm  Needle Gauge: 21     Additional Needles:   Procedures:,,,, ultrasound used (permanent image in chart),,,,  Narrative:  Start time: 11/06/2017 10:00 AM End time: 11/06/2017 10:05 AM Injection made incrementally with aspirations every 5 mL.  Performed by: Personally  Anesthesiologist: Barnet Glasgow, MD

## 2017-11-06 NOTE — Anesthesia Procedure Notes (Addendum)
Spinal  Patient location during procedure: OB Start time: 11/06/2017 11:25 AM End time: 11/06/2017 11:32 AM Staffing Anesthesiologist: Barnet Glasgow, MD Performed: anesthesiologist  Preanesthetic Checklist Completed: patient identified, surgical consent, pre-op evaluation, timeout performed, IV checked, risks and benefits discussed and monitors and equipment checked Spinal Block Patient position: sitting Prep: site prepped and draped and DuraPrep Patient monitoring: heart rate, cardiac monitor, continuous pulse ox and blood pressure Approach: midline Location: L2-3 Injection technique: single-shot Needle Needle type: Pencan  Needle gauge: 24 G Needle length: 10 cm Assessment Sensory level: T4

## 2017-11-06 NOTE — Op Note (Signed)
OPERATIVE REPORT-TOTAL KNEE ARTHROPLASTY   Pre-operative diagnosis- Osteoarthritis  Right knee(s)  Post-operative diagnosis- Osteoarthritis Right knee(s)  Procedure-  Right  Total Knee Arthroplasty (Depuy Attune)  Surgeon- Dione Plover. Jojo Pehl, MD  Assistant- Ardeen Jourdain, PA-C   Anesthesia-  Adductor canal block and spinal  EBL-25 ml   Drains Hemovac  Tourniquet time- 34 minutes @ 188 mm Hg    Complications- None  Condition-PACU - hemodynamically stable.   Brief Clinical Note  Heidi Reeves is a 57 y.o. year old female with end stage OA of her right knee with progressively worsening pain and dysfunction. She has constant pain, with activity and at rest and significant functional deficits with difficulties even with ADLs. She has had extensive non-op management including analgesics, injections of cortisone and viscosupplements, and home exercise program, but remains in significant pain with significant dysfunction.Radiographs show bone on bone arthritis medial and patellofemoral. She presents now for right Total Knee Arthroplasty.    Procedure in detail---   The patient is brought into the operating room and positioned supine on the operating table. After successful administration of  Adductor canal block and spinal,   a tourniquet is placed high on the  Right thigh(s) and the lower extremity is prepped and draped in the usual sterile fashion. Time out is performed by the operating team and then the  Right lower extremity is wrapped in Esmarch, knee flexed and the tourniquet inflated to 300 mmHg.       A midline incision is made with a ten blade through the subcutaneous tissue to the level of the extensor mechanism. A fresh blade is used to make a medial parapatellar arthrotomy. Soft tissue over the proximal medial tibia is subperiosteally elevated to the joint line with a knife and into the semimembranosus bursa with a Cobb elevator. Soft tissue over the proximal lateral tibia  is elevated with attention being paid to avoiding the patellar tendon on the tibial tubercle. The patella is everted, knee flexed 90 degrees and the ACL and PCL are removed. Findings are bone on bone medial and patellofemoral with large marginal osteophytes        The drill is used to create a starting hole in the distal femur and the canal is thoroughly irrigated with sterile saline to remove the fatty contents. The 5 degree Right  valgus alignment guide is placed into the femoral canal and the distal femoral cutting block is pinned to remove 9 mm off the distal femur. Resection is made with an oscillating saw.      The tibia is subluxed forward and the menisci are removed. The extramedullary alignment guide is placed referencing proximally at the medial aspect of the tibial tubercle and distally along the second metatarsal axis and tibial crest. The block is pinned to remove 65mm off the more deficient medial  side. Resection is made with an oscillating saw. Size 5is the most appropriate size for the tibia and the proximal tibia is prepared with the modular drill and keel punch for that size.      The femoral sizing guide is placed and size 6 is most appropriate. Rotation is marked off the epicondylar axis and confirmed by creating a rectangular flexion gap at 90 degrees. The size 6 cutting block is pinned in this rotation and the anterior, posterior and chamfer cuts are made with the oscillating saw. The intercondylar block is then placed and that cut is made.      Trial size 5 tibial component, trial  size 6 posterior stabilized femur and a 10  mm posterior stabilized rotating platform insert trial is placed. Full extension is achieved with excellent varus/valgus and anterior/posterior balance throughout full range of motion. The patella is everted and thickness measured to be 24  mm. Free hand resection is taken to 14 mm, a 38 template is placed, lug holes are drilled, trial patella is placed, and it tracks  normally. Osteophytes are removed off the posterior femur with the trial in place. All trials are removed and the cut bone surfaces prepared with pulsatile lavage. Cement is mixed and once ready for implantation, the size 5 tibial implant, size  6 posterior stabilized femoral component, and the size 38 patella are cemented in place and the patella is held with the clamp. The trial insert is placed and the knee held in full extension. The Exparel (20 ml mixed with 60 ml saline) is injected into the extensor mechanism, posterior capsule, medial and lateral gutters and subcutaneous tissues.  All extruded cement is removed and once the cement is hard the permanent 10 mm posterior stabilized rotating platform insert is placed into the tibial tray.      The wound is copiously irrigated with saline solution and the extensor mechanism closed over a hemovac drain with #1 V-loc suture. The tourniquet is released for a total tourniquet time of 34  minutes. Flexion against gravity is 140 degrees and the patella tracks normally. Subcutaneous tissue is closed with 2.0 vicryl and subcuticular with running 4.0 Monocryl. The incision is cleaned and dried and steri-strips and a bulky sterile dressing are applied. The limb is placed into a knee immobilizer and the patient is awakened and transported to recovery in stable condition.      Please note that a surgical assistant was a medical necessity for this procedure in order to perform it in a safe and expeditious manner. Surgical assistant was necessary to retract the ligaments and vital neurovascular structures to prevent injury to them and also necessary for proper positioning of the limb to allow for anatomic placement of the prosthesis.   Dione Plover Jannell Franta, MD    11/06/2017, 12:34 PM

## 2017-11-06 NOTE — Transfer of Care (Signed)
Immediate Anesthesia Transfer of Care Note  Patient: Heidi Reeves  Procedure(s) Performed: RIGHT TOTAL KNEE ARTHROPLASTY (Right Knee)  Patient Location: PACU  Anesthesia Type:Spinal  Level of Consciousness: awake, alert  and oriented  Airway & Oxygen Therapy: Patient Spontanous Breathing and Patient connected to face mask oxygen  Post-op Assessment: Report given to RN and Post -op Vital signs reviewed and stable  Post vital signs: Reviewed and stable  Last Vitals:  Vitals Value Taken Time  BP    Temp    Pulse    Resp    SpO2      Last Pain:  Vitals:   11/06/17 1030  TempSrc:   PainSc: 0-No pain      Patients Stated Pain Goal: 4 (09/38/18 2993)  Complications: No apparent anesthesia complications

## 2017-11-06 NOTE — Discharge Instructions (Addendum)
° °Dr. Frank Aluisio °Total Joint Specialist °[facility] °[address] °[place], [place] [address] °[phone] ° °TOTAL KNEE REPLACEMENT POSTOPERATIVE DIRECTIONS ° °Knee Rehabilitation, Guidelines Following Surgery  °Results after knee surgery are often greatly improved when you follow the exercise, range of motion and muscle strengthening exercises prescribed by your doctor. Safety measures are also important to protect the knee from further injury. Any time any of these exercises cause you to have increased pain or swelling in your knee joint, decrease the amount until you are comfortable again and slowly increase them. If you have problems or questions, call your caregiver or physical therapist for advice.  ° °HOME CARE INSTRUCTIONS  °Remove items at home which could result in a fall. This includes throw rugs or furniture in walking pathways.  °· ICE to the affected knee every three hours for 30 minutes at a time and then as needed for pain and swelling.  Continue to use ice on the knee for pain and swelling from surgery. You may notice swelling that will progress down to the foot and ankle.  This is normal after surgery.  Elevate the leg when you are not up walking on it.   °· Continue to use the breathing machine which will help keep your temperature down.  It is common for your temperature to cycle up and down following surgery, especially at night when you are not up moving around and exerting yourself.  The breathing machine keeps your lungs expanded and your temperature down. °· Do not place pillow under knee, focus on keeping the knee straight while resting ° °DIET °You may resume your previous home diet once your are discharged from the hospital. ° °DRESSING / WOUND CARE / SHOWERING °You may shower 3 days after surgery, but keep the wounds dry during showering.  You may use an occlusive plastic wrap (Press'n Seal for example), NO SOAKING/SUBMERGING IN THE BATHTUB.  If the bandage gets  wet, change with a clean dry gauze.  If the incision gets wet, pat the wound dry with a clean towel. °You may start showering once you are discharged home but do not submerge the incision under water. Just pat the incision dry and apply a dry gauze dressing on daily. °Change the surgical dressing daily and reapply a dry dressing each time. ° °ACTIVITY °Walk with your walker as instructed. °Use walker as long as suggested by your caregivers. °Avoid periods of inactivity such as sitting longer than an hour when not asleep. This helps prevent blood clots.  °You may resume a sexual relationship in one month or when given the OK by your doctor.  °You may return to work once you are cleared by your doctor.  °Do not drive a car for 6 weeks or until released by you surgeon.  °Do not drive while taking narcotics. ° °WEIGHT BEARING °Weight bearing as tolerated with assist device (walker, cane, etc) as directed, use it as long as suggested by your surgeon or therapist, typically at least 4-6 weeks. ° °POSTOPERATIVE CONSTIPATION PROTOCOL °Constipation - defined medically as fewer than three stools per week and severe constipation as less than one stool per week. ° °One of the most common issues patients have following surgery is constipation.  Even if you have a regular bowel pattern at home, your normal regimen is likely to be disrupted due to multiple reasons following surgery.  Combination of anesthesia, postoperative narcotics, change in appetite and fluid intake all can affect your bowels.    In order to avoid complications following surgery, here are some recommendations in order to help you during your recovery period. ° °Colace (docusate) - Pick up an over-the-counter form of Colace or another stool softener and take twice a day as long as you are requiring postoperative pain medications.  Take with a full glass of water daily.  If you experience loose stools or diarrhea, hold the colace until you stool forms back up.  If  your symptoms do not get better within 1 week or if they get worse, check with your doctor. ° °Dulcolax (bisacodyl) - Pick up over-the-counter and take as directed by the product packaging as needed to assist with the movement of your bowels.  Take with a full glass of water.  Use this product as needed if not relieved by Colace only.  ° °MiraLax (polyethylene glycol) - Pick up over-the-counter to have on hand.  MiraLax is a solution that will increase the amount of water in your bowels to assist with bowel movements.  Take as directed and can mix with a glass of water, juice, soda, coffee, or tea.  Take if you go more than two days without a movement. °Do not use MiraLax more than once per day. Call your doctor if you are still constipated or irregular after using this medication for 7 days in a row. ° °If you continue to have problems with postoperative constipation, please contact the office for further assistance and recommendations.  If you experience "the worst abdominal pain ever" or develop nausea or vomiting, please contact the office immediatly for further recommendations for treatment. ° °ITCHING ° If you experience itching with your medications, try taking only a single pain pill, or even half a pain pill at a time.  You can also use Benadryl over the counter for itching or also to help with sleep.  ° °TED HOSE STOCKINGS °Wear the elastic stockings on both legs for three weeks following surgery during the day but you may remove then at night for sleeping. ° °MEDICATIONS °See your medication summary on the “After Visit Summary” that the nursing staff will review with you prior to discharge.  You may have some home medications which will be placed on hold until you complete the course of blood thinner medication.  It is important for you to complete the blood thinner medication as prescribed by your surgeon.  Continue your approved medications as instructed at time of discharge. ° °PRECAUTIONS °If you  experience chest pain or shortness of breath - call 911 immediately for transfer to the hospital emergency department.  °If you develop a fever greater that 101 F, purulent drainage from wound, increased redness or drainage from wound, foul odor from the wound/dressing, or calf pain - CONTACT YOUR SURGEON.   °                                                °FOLLOW-UP APPOINTMENTS °Make sure you keep all of your appointments after your operation with your surgeon and caregivers. You should call the office at the above phone number and make an appointment for approximately two weeks after the date of your surgery or on the date instructed by your surgeon outlined in the "After Visit Summary". ° ° °RANGE OF MOTION AND STRENGTHENING EXERCISES  °Rehabilitation of the knee is important following a knee injury or   an operation. After just a few days of immobilization, the muscles of the thigh which control the knee become weakened and shrink (atrophy). Knee exercises are designed to build up the tone and strength of the thigh muscles and to improve knee motion. Often times heat used for twenty to thirty minutes before working out will loosen up your tissues and help with improving the range of motion but do not use heat for the first two weeks following surgery. These exercises can be done on a training (exercise) mat, on the floor, on a table or on a bed. Use what ever works the best and is most comfortable for you Knee exercises include:  °Leg Lifts - While your knee is still immobilized in a splint or cast, you can do straight leg raises. Lift the leg to 60 degrees, hold for 3 sec, and slowly lower the leg. Repeat 10-20 times 2-3 times daily. Perform this exercise against resistance later as your knee gets better.  °Quad and Hamstring Sets - Tighten up the muscle on the front of the thigh (Quad) and hold for 5-10 sec. Repeat this 10-20 times hourly. Hamstring sets are done by pushing the foot backward against an object  and holding for 5-10 sec. Repeat as with quad sets.  °· Leg Slides: Lying on your back, slowly slide your foot toward your buttocks, bending your knee up off the floor (only go as far as is comfortable). Then slowly slide your foot back down until your leg is flat on the floor again. °· Angel Wings: Lying on your back spread your legs to the side as far apart as you can without causing discomfort.  °A rehabilitation program following serious knee injuries can speed recovery and prevent re-injury in the future due to weakened muscles. Contact your doctor or a physical therapist for more information on knee rehabilitation.  ° °IF YOU ARE TRANSFERRED TO A SKILLED REHAB FACILITY °If the patient is transferred to a skilled rehab facility following release from the hospital, a list of the current medications will be sent to the facility for the patient to continue.  When discharged from the skilled rehab facility, please have the facility set up the patient's Home Health Physical Therapy prior to being released. Also, the skilled facility will be responsible for providing the patient with their medications at time of release from the facility to include their pain medication, the muscle relaxants, and their blood thinner medication. If the patient is still at the rehab facility at time of the two week follow up appointment, the skilled rehab facility will also need to assist the patient in arranging follow up appointment in our office and any transportation needs. ° °MAKE SURE YOU:  °Understand these instructions.  °Get help right away if you are not doing well or get worse.  ° ° °Pick up stool softner and laxative for home use following surgery while on pain medications. °Do not submerge incision under water. °Please use good hand washing techniques while changing dressing each day. °May shower starting three days after surgery. °Please use a clean towel to pat the incision dry following showers. °Continue to use ice for  pain and swelling after surgery. °Do not use any lotions or creams on the incision until instructed by your surgeon. ° °Take Xarelto for two and a half more weeks following discharge from the hospital, then discontinue Xarelto. °Once the patient has completed the blood thinner regimen, then take a Baby 81 mg Aspirin daily for three   more weeks. ° ° ° °Information on my medicine - XARELTO® (Rivaroxaban) ° °Why was Xarelto® prescribed for you? °Xarelto® was prescribed for you to reduce the risk of blood clots forming after orthopedic surgery. The medical term for these abnormal blood clots is venous thromboembolism (VTE). ° °What do you need to know about xarelto® ? °Take your Xarelto® ONCE DAILY at the same time every day. °You may take it either with or without food. ° °If you have difficulty swallowing the tablet whole, you may crush it and mix in applesauce just prior to taking your dose. ° °Take Xarelto® exactly as prescribed by your doctor and DO NOT stop taking Xarelto® without talking to the doctor who prescribed the medication.  Stopping without other VTE prevention medication to take the place of Xarelto® may increase your risk of developing a clot. ° °After discharge, you should have regular check-up appointments with your healthcare provider that is prescribing your Xarelto®.   ° °What do you do if you miss a dose? °If you miss a dose, take it as soon as you remember on the same day then continue your regularly scheduled once daily regimen the next day. Do not take two doses of Xarelto® on the same day.  ° °Important Safety Information °A possible side effect of Xarelto® is bleeding. You should call your healthcare provider right away if you experience any of the following: °? Bleeding from an injury or your nose that does not stop. °? Unusual colored urine (red or dark brown) or unusual colored stools (red or black). °? Unusual bruising for unknown reasons. °? A serious fall or if you hit your head (even  if there is no bleeding). ° °Some medicines may interact with Xarelto® and might increase your risk of bleeding while on Xarelto®. To help avoid this, consult your healthcare provider or pharmacist prior to using any new prescription or non-prescription medications, including herbals, vitamins, non-steroidal anti-inflammatory drugs (NSAIDs) and supplements. ° °This website has more information on Xarelto®: www.xarelto.com. ° ° ° °

## 2017-11-06 NOTE — Anesthesia Postprocedure Evaluation (Signed)
Anesthesia Post Note  Patient: Heidi Reeves  Procedure(s) Performed: RIGHT TOTAL KNEE ARTHROPLASTY (Right Knee)     Patient location during evaluation: PACU Anesthesia Type: Regional and Spinal Level of consciousness: oriented and awake and alert Pain management: pain level controlled Vital Signs Assessment: post-procedure vital signs reviewed and stable Respiratory status: spontaneous breathing, respiratory function stable and patient connected to nasal cannula oxygen Cardiovascular status: blood pressure returned to baseline and stable Postop Assessment: no headache, no backache and no apparent nausea or vomiting Anesthetic complications: no    Last Vitals:  Vitals:   11/06/17 1445 11/06/17 1543  BP: 121/82 121/74  Pulse: 61 61  Resp: 14 13  Temp: 36.6 C   SpO2: 100% 100%    Last Pain:  Vitals:   11/06/17 1536  TempSrc:   PainSc: 7                  Barnet Glasgow

## 2017-11-07 ENCOUNTER — Encounter (HOSPITAL_COMMUNITY): Payer: Self-pay | Admitting: Orthopedic Surgery

## 2017-11-07 LAB — BASIC METABOLIC PANEL
Anion gap: 7 (ref 5–15)
BUN: 10 mg/dL (ref 6–20)
CALCIUM: 8.5 mg/dL — AB (ref 8.9–10.3)
CO2: 28 mmol/L (ref 22–32)
CREATININE: 0.63 mg/dL (ref 0.44–1.00)
Chloride: 102 mmol/L (ref 101–111)
GFR calc non Af Amer: >60 mL/min (ref >60)
Glucose, Bld: 121 mg/dL — ABNORMAL HIGH (ref 65–99)
Potassium: 3.6 mmol/L (ref 3.5–5.1)
SODIUM: 137 mmol/L (ref 135–145)

## 2017-11-07 LAB — CBC
HCT: 32.3 % — ABNORMAL LOW (ref 36.0–46.0)
Hemoglobin: 10.7 g/dL — ABNORMAL LOW (ref 12.0–15.0)
MCH: 29.1 pg (ref 26.0–34.0)
MCHC: 33.1 g/dL (ref 30.0–36.0)
MCV: 87.8 fL (ref 78.0–100.0)
Platelets: 234 10*3/uL (ref 150–400)
RBC: 3.68 MIL/uL — ABNORMAL LOW (ref 3.87–5.11)
RDW: 12.6 % (ref 11.5–15.5)
WBC: 10.6 10*3/uL — ABNORMAL HIGH (ref 4.0–10.5)

## 2017-11-07 MED ORDER — GABAPENTIN 300 MG PO CAPS: 300.0000 mg | ORAL_CAPSULE | Freq: Three times a day (TID) | ORAL | 0 refills | Status: AC

## 2017-11-07 MED ORDER — METHOCARBAMOL 500 MG PO TABS: 500.0000 mg | ORAL_TABLET | Freq: Four times a day (QID) | ORAL | 0 refills | Status: AC | PRN

## 2017-11-07 MED ORDER — RIVAROXABAN 10 MG PO TABS: 10.0000 mg | ORAL_TABLET | Freq: Every day | ORAL | 0 refills | Status: DC

## 2017-11-07 MED ORDER — OXYCODONE HCL 5 MG PO TABS: 5.0000 mg | ORAL_TABLET | ORAL | 0 refills | Status: AC | PRN

## 2017-11-07 NOTE — Evaluation (Signed)
Physical Therapy Evaluation Patient Details Name: Heidi Reeves MRN: [id number] DOB: [birth year] Today's Date: 11/07/2017   History of Present Illness  R TKA  Clinical Impression  Pt is s/p TKA resulting in the deficits listed below (see PT Problem List). * Pt will benefit from skilled PT to increase their independence and safety with mobility to allow discharge to the venue listed below.      Follow Up Recommendations Outpatient PT;Follow surgeon's recommendation for DC plan and follow-up therapies    Equipment Recommendations  Rolling walker with 5" wheels    Recommendations for Other Services       Precautions / Restrictions Precautions Precautions: Knee Required Braces or Orthoses: Knee Immobilizer - Right Knee Immobilizer - Right: Discontinue once straight leg raise with < 10 degree lag Restrictions Weight Bearing Restrictions: No      Mobility  Bed Mobility                  Transfers Overall transfer level: Needs assistance Equipment used: Rolling walker (2 wheeled) Transfers: Sit to/from Stand Sit to Stand: Min guard         General transfer comment: cues for hand placement, and safety  Ambulation/Gait Ambulation/Gait assistance: Min guard Ambulation Distance (Feet): 90 Feet Assistive device: Rolling walker (2 wheeled) Gait Pattern/deviations: Step-to pattern;Antalgic     General Gait Details: cues for sequence, and posture  Stairs            Wheelchair Mobility    Modified Rankin (Stroke Patients Only)       Balance                                             Pertinent Vitals/Pain Pain Assessment: 0-10 Pain Score: 3  Pain Location: right knee Pain Descriptors / Indicators: Aching;Sore Pain Intervention(s): Premedicated before session;Monitored during session    Heidi Reeves expects to be discharged to:: Private residence Living Arrangements: Spouse/significant other Available Help at  Discharge: Family Type of Home: House Home Access: Stairs to enter   Technical brewer of Steps: 2 Home Layout: Two level;Able to live on main level with bedroom/bathroom Home Equipment: None      Prior Function Level of Independence: Independent               Hand Dominance        Extremity/Trunk Assessment   Upper Extremity Assessment Upper Extremity Assessment: Defer to OT evaluation    Lower Extremity Assessment Lower Extremity Assessment: RLE deficits/detail RLE Deficits / Details: ankle WFL; knee extension and hip flexion 2+/5, knee AAROM 5* to 60*       Communication   Communication: No difficulties  Cognition Arousal/Alertness: Awake/alert Behavior During Therapy: WFL for tasks assessed/performed Overall Cognitive Status: Within Functional Limits for tasks assessed                                        General Comments      Exercises Total Joint Exercises Ankle Circles/Pumps: AROM;Both;10 reps Quad Sets: AROM;Both Heel Slides: AROM;10 reps;Right Straight Leg Raises: AROM;Right;10 reps   Assessment/Plan    PT Assessment Patient needs continued PT services  PT Problem List Decreased strength;Decreased activity tolerance;Decreased mobility;Pain;Decreased knowledge of use of DME       PT Treatment Interventions  DME instruction;Gait training;Therapeutic activities;Therapeutic exercise;Patient/family education;Balance training;Functional mobility training;Stair training    PT Goals (Current goals can be found in the Care Plan section)  Acute Rehab PT Goals Patient Stated Goal: get back to life PT Goal Formulation: With patient Time For Goal Achievement: 11/14/17 Potential to Achieve Goals: Good    Frequency 7X/week   Barriers to discharge        Co-evaluation               AM-PAC PT "6 Clicks" Daily Activity  Outcome Measure Difficulty turning over in bed (including adjusting bedclothes, sheets and blankets)?:  A Little Difficulty moving from lying on back to sitting on the side of the bed? : A Little Difficulty sitting down on and standing up from a chair with arms (e.g., wheelchair, bedside commode, etc,.)?: A Little Help needed moving to and from a bed to chair (including a wheelchair)?: A Little Help needed walking in hospital room?: A Little Help needed climbing 3-5 steps with a railing? : A Lot 6 Click Score: 17    End of Session Equipment Utilized During Treatment: Gait belt Activity Tolerance: Patient tolerated treatment well Patient left: with call bell/phone within reach;with family/visitor present;in chair   PT Visit Diagnosis: Unsteadiness on feet (R26.81);Difficulty in walking, not elsewhere classified (R26.2)    Time: 1601-0932 PT Time Calculation (min) (ACUTE ONLY): 23 min   Charges:   PT Evaluation $PT Eval Low Complexity: 1 Low PT Treatments $Gait Training: 8-22 mins   PT G CodesKenyon Reeves, PT Pager: [phone] 11/07/2017   [facility] 11/07/2017, 11:39 AM

## 2017-11-07 NOTE — Discharge Summary (Signed)
Physician Discharge Summary   Patient ID: Heidi Reeves MRN: [id number] DOB/AGE: 57-Aug-1962 57 y.o.  Admit date: 11/06/2017 Discharge date: 11/08/2017  Primary Diagnosis:  Osteoarthritis Right knee(s)   Admission Diagnoses:  Past Medical History:  Diagnosis Date  . Arthritis    bil knees  . GERD (gastroesophageal reflux disease)    Discharge Diagnoses:   Principal Problem:   OA (osteoarthritis) of knee  Estimated body mass index is 33.28 kg/m as calculated from the following:   Height as of this encounter: 5' 5"  (1.651 m).   Weight as of this encounter: 90.7 kg (200 lb).  Procedure:  Procedure(s) (LRB): RIGHT TOTAL KNEE ARTHROPLASTY (Right)   Consults: None  HPI: Heidi Reeves is a 57 y.o. year old female with end stage OA of her right knee with progressively worsening pain and dysfunction. She has constant pain, with activity and at rest and significant functional deficits with difficulties even with ADLs. She has had extensive non-op management including analgesics, injections of cortisone and viscosupplements, and home exercise program, but remains in significant pain with significant dysfunction.Radiographs show bone on bone arthritis medial and patellofemoral. She presents now for right Total Knee Arthroplasty.     Laboratory Data: Admission on 11/06/2017  Component Date Value Ref Range Status  . WBC 11/07/2017 10.6* 4.0 - 10.5 K/uL Final  . RBC 11/07/2017 3.68* 3.87 - 5.11 MIL/uL Final  . Hemoglobin 11/07/2017 10.7* 12.0 - 15.0 g/dL Final  . HCT 11/07/2017 32.3* 36.0 - 46.0 % Final  . MCV 11/07/2017 87.8  78.0 - 100.0 fL Final  . MCH 11/07/2017 29.1  26.0 - 34.0 pg Final  . MCHC 11/07/2017 33.1  30.0 - 36.0 g/dL Final  . RDW 11/07/2017 12.6  11.5 - 15.5 % Final  . Platelets 11/07/2017 234  150 - 400 K/uL Final   Performed at [facility], [place] [address] [place], [place] [address]  . Sodium 11/07/2017 137  135 - 145 mmol/L Final  .  Potassium 11/07/2017 3.6  3.5 - 5.1 mmol/L Final  . Chloride 11/07/2017 102  101 - 111 mmol/L Final  . CO2 11/07/2017 28  22 - 32 mmol/L Final  . Glucose, Bld 11/07/2017 121* 65 - 99 mg/dL Final  . BUN 11/07/2017 10  6 - 20 mg/dL Final  . Creatinine, Ser 11/07/2017 0.63  0.44 - 1.00 mg/dL Final  . Calcium 11/07/2017 8.5* 8.9 - 10.3 mg/dL Final  . GFR calc non Af Amer 11/07/2017 >60  >60 mL/min Final  . GFR calc Af Amer 11/07/2017 >60  >60 mL/min Final   Comment: (NOTE) The eGFR has been calculated using the CKD EPI equation. This calculation has not been validated in all clinical situations. eGFR's persistently <60 mL/min signify possible Chronic Kidney Disease.   Georgiann Hahn gap 11/07/2017 7  5 - 15 Final   Performed at [facility], [place] [address], [place], [place] [address]  Hospital Outpatient Visit on 11/01/2017  Component Date Value Ref Range Status  . MRSA, PCR 11/01/2017 NEGATIVE  NEGATIVE Final  . Staphylococcus aureus 11/01/2017 NEGATIVE  NEGATIVE Final   Comment: (NOTE) The Xpert SA Assay (FDA approved for NASAL specimens in patients 41 years of age and older), is one component of a comprehensive surveillance program. It is not intended to diagnose infection nor to guide or monitor treatment. Performed at [facility], [place] [address], [place], [place] [address]   . aPTT 11/01/2017 29  24 - 36  seconds Final   Performed at [facility], [place] [address], [place], [place] [address]  . WBC 11/01/2017 8.2  4.0 - 10.5 K/uL Final  . RBC 11/01/2017 4.56  3.87 - 5.11 MIL/uL Final  . Hemoglobin 11/01/2017 13.5  12.0 - 15.0 g/dL Final  . HCT 11/01/2017 39.2  36.0 - 46.0 % Final  . MCV 11/01/2017 86.0  78.0 - 100.0 fL Final  . MCH 11/01/2017 29.6  26.0 - 34.0 pg Final  . MCHC 11/01/2017 34.4  30.0 - 36.0 g/dL Final  . RDW 11/01/2017 12.8  11.5 - 15.5 % Final  . Platelets 11/01/2017 286  150 - 400 K/uL Final   Performed at  [facility], [place] [address], [place], [place] [address]  . Sodium 11/01/2017 133* 135 - 145 mmol/L Final  . Potassium 11/01/2017 3.6  3.5 - 5.1 mmol/L Final  . Chloride 11/01/2017 94* 101 - 111 mmol/L Final  . CO2 11/01/2017 26  22 - 32 mmol/L Final  . Glucose, Bld 11/01/2017 96  65 - 99 mg/dL Final  . BUN 11/01/2017 14  6 - 20 mg/dL Final  . Creatinine, Ser 11/01/2017 0.80  0.44 - 1.00 mg/dL Final  . Calcium 11/01/2017 9.4  8.9 - 10.3 mg/dL Final  . Total Protein 11/01/2017 7.5  6.5 - 8.1 g/dL Final  . Albumin 11/01/2017 4.4  3.5 - 5.0 g/dL Final  . AST 11/01/2017 25  15 - 41 U/L Final  . ALT 11/01/2017 19  14 - 54 U/L Final  . Alkaline Phosphatase 11/01/2017 75  38 - 126 U/L Final  . Total Bilirubin 11/01/2017 0.8  0.3 - 1.2 mg/dL Final  . GFR calc non Af Amer 11/01/2017 >60  >60 mL/min Final  . GFR calc Af Amer 11/01/2017 >60  >60 mL/min Final   Comment: (NOTE) The eGFR has been calculated using the CKD EPI equation. This calculation has not been validated in all clinical situations. eGFR's persistently <60 mL/min signify possible Chronic Kidney Disease.   Georgiann Hahn gap 11/01/2017 13  5 - 15 Final   Performed at [facility], [place] [address] [place], [place] [address]  . Prothrombin Time 11/01/2017 13.0  11.4 - 15.2 seconds Final  . INR 11/01/2017 0.99   Final   Performed at [facility], [place] [address] [place], [place] [address]  . ABO/RH(D) 11/01/2017 A POS   Final  . Antibody Screen 11/01/2017 NEG   Final  . Sample Expiration 11/01/2017 11/09/2017   Final  . Extend sample reason 11/01/2017    Final                   Value:NO TRANSFUSIONS OR PREGNANCY IN THE PAST 3 MONTHS Performed at [facility], [place] [address] [place], [place] [address]   . ABO/RH(D) 11/01/2017    Final                   Value:A POS Performed at [facility], [place] [address] [place], [place] [address]       X-Rays:No results found.  EKG:No orders found for this or any previous visit.   Hospital Course: Heidi Reeves is a 57 y.o. who was admitted to [facility]. They were brought to the operating room on 11/06/2017 and underwent Procedure(s): RIGHT TOTAL KNEE ARTHROPLASTY.  Patient tolerated the procedure well and was later transferred to the recovery room and then to the orthopaedic  floor for postoperative care.  They were given PO and IV analgesics for pain control following their surgery.  They were given 24 hours of postoperative antibiotics of  Anti-infectives (From admission, onward)   Start     Dose/Rate Route Frequency Ordered Stop   11/06/17 1730  ceFAZolin (ANCEF) IVPB 2g/100 mL premix     2 g 200 mL/hr over 30 Minutes Intravenous Every 6 hours 11/06/17 1447 11/06/17 2302   11/06/17 0913  ceFAZolin (ANCEF) IVPB 2g/100 mL premix     2 g 200 mL/hr over 30 Minutes Intravenous On call to O.R. 11/06/17 0913 11/06/17 1135     and started on DVT prophylaxis in the form of Xarelto.   PT and OT were ordered for total joint protocol.  Discharge planning consulted to help with postop disposition and equipment needs.  Patient had a decent night on the evening of surgery.  They started to get up OOB with therapy on day one. Hemovac drain was pulled without difficulty.  Continued to work with therapy into day two.  Dressing was changed on day two and the incision was healing well. Patient was seen in rounds and was ready to go home.   Diet - Regular diet Follow up - in 2 weeks Activity - WBAT Disposition - Home Condition Upon Discharge - Stable D/C Meds - See DC Summary DVT Prophylaxis - Xarelto   Discharge Instructions    Call MD / Call 911   Complete by:  As directed    If you experience chest pain or shortness of breath, CALL 911 and be transported to the hospital emergency room.  If you develope a fever above 101 F, pus (white drainage) or increased drainage or redness at the  wound, or calf pain, call your surgeon's office.   Change dressing   Complete by:  As directed    Change dressing daily with sterile 4 x 4 inch gauze dressing and apply TED hose. Do not submerge the incision under water.   Constipation Prevention   Complete by:  As directed    Drink plenty of fluids.  Prune juice may be helpful.  You may use a stool softener, such as Colace (over the counter) 100 mg twice a day.  Use MiraLax (over the counter) for constipation as needed.   Diet general   Complete by:  As directed    Discharge instructions   Complete by:  As directed    Take Xarelto for two and a half more weeks, then discontinue Xarelto. Once the patient has completed the blood thinner regimen, then take a Baby 81 mg Aspirin daily for three more weeks.   Pick up stool softner and laxative for home use following surgery while on pain medications. Do not submerge incision under water. Please use good hand washing techniques while changing dressing each day. May shower starting three days after surgery. Please use a clean towel to pat the incision dry following showers. Continue to use ice for pain and swelling after surgery. Do not use any lotions or creams on the incision until instructed by your surgeon.  Wear both TED hose on both legs during the day every day for three weeks, but may remove the TED hose at night at home.  Postoperative Constipation Protocol  Constipation - defined medically as fewer than three stools per week and severe constipation as less than one stool per week.  One of the most common issues patients have following surgery is constipation.  Even if  you have a regular bowel pattern at home, your normal regimen is likely to be disrupted due to multiple reasons following surgery.  Combination of anesthesia, postoperative narcotics, change in appetite and fluid intake all can affect your bowels.  In order to avoid complications following surgery, here are some  recommendations in order to help you during your recovery period.  Colace (docusate) - Pick up an over-the-counter form of Colace or another stool softener and take twice a day as long as you are requiring postoperative pain medications.  Take with a full glass of water daily.  If you experience loose stools or diarrhea, hold the colace until you stool forms back up.  If your symptoms do not get better within 1 week or if they get worse, check with your doctor.  Dulcolax (bisacodyl) - Pick up over-the-counter and take as directed by the product packaging as needed to assist with the movement of your bowels.  Take with a full glass of water.  Use this product as needed if not relieved by Colace only.   MiraLax (polyethylene glycol) - Pick up over-the-counter to have on hand.  MiraLax is a solution that will increase the amount of water in your bowels to assist with bowel movements.  Take as directed and can mix with a glass of water, juice, soda, coffee, or tea.  Take if you go more than two days without a movement. Do not use MiraLax more than once per day. Call your doctor if you are still constipated or irregular after using this medication for 7 days in a row.  If you continue to have problems with postoperative constipation, please contact the office for further assistance and recommendations.  If you experience "the worst abdominal pain ever" or develop nausea or vomiting, please contact the office immediatly for further recommendations for treatment.   Do not put a pillow under the knee. Place it under the heel.   Complete by:  As directed    Do not sit on low chairs, stoools or toilet seats, as it may be difficult to get up from low surfaces   Complete by:  As directed    Driving restrictions   Complete by:  As directed    No driving until released by the physician.   Increase activity slowly as tolerated   Complete by:  As directed    Lifting restrictions   Complete by:  As directed     No lifting until released by the physician.   Patient may shower   Complete by:  As directed    You may shower without a dressing once there is no drainage.  Do not wash over the wound.  If drainage remains, do not shower until drainage stops.   TED hose   Complete by:  As directed    Use stockings (TED hose) for 3 weeks on both leg(s).  You may remove them at night for sleeping.   Weight bearing as tolerated   Complete by:  As directed    Laterality:  right   Extremity:  Lower     Allergies as of 11/07/2017   No Known Allergies     Medication List    STOP taking these medications   ibuprofen 200 MG tablet Commonly known as:  ADVIL,MOTRIN   JOINT HEALTH PO   meloxicam 15 MG tablet Commonly known as:  MOBIC   multivitamin with minerals Tabs tablet     TAKE these medications   fluticasone 50 MCG/ACT nasal  spray Commonly known as:  FLONASE Place 1 spray into both nostrils daily as needed for allergies.   gabapentin 300 MG capsule Commonly known as:  NEURONTIN Take 1 capsule (300 mg total) by mouth 3 (three) times daily. Gabapentin 300 mg Protocol Take a 300 mg capsule three times a day for two weeks, Then a 300 mg capsule twice a day for two weeks, Then a 300 mg capsule once a day for two weeks, then discontinue the Gabapentin.   hydrochlorothiazide 12.5 MG capsule Commonly known as:  MICROZIDE Take 12.5 mg by mouth daily.   methocarbamol 500 MG tablet Commonly known as:  ROBAXIN Take 1 tablet (500 mg total) by mouth every 6 (six) hours as needed for muscle spasms.   oxyCODONE 5 MG immediate release tablet Commonly known as:  Oxy IR/ROXICODONE Take 1-2 tablets (5-10 mg total) by mouth every 4 (four) hours as needed for moderate pain or severe pain.   rivaroxaban 10 MG Tabs tablet Commonly known as:  XARELTO Take 1 tablet (10 mg total) by mouth daily with breakfast. Take Xarelto for two and a half more weeks following discharge from the hospital, then discontinue  Xarelto. Once the patient has completed the blood thinner regimen, then take a Baby 81 mg Aspirin daily for three more weeks. Start taking on:  11/08/2017            Durable Medical Equipment  (From admission, onward)        Start     Ordered   11/07/17 0828  For home use only DME Walker rolling  Once    Question:  Patient needs a walker to treat with the following condition  Answer:  S/P knee surgery   11/07/17 0828   11/07/17 0828  For home use only DME 3 n 1  Once     11/07/17 3716       Discharge Care Instructions  (From admission, onward)        Start     Ordered   11/07/17 0000  Weight bearing as tolerated    Question Answer Comment  Laterality right   Extremity Lower      11/07/17 2118   11/07/17 0000  Change dressing    Comments:  Change dressing daily with sterile 4 x 4 inch gauze dressing and apply TED hose. Do not submerge the incision under water.   11/07/17 2118     Follow-up Information    [facility] Follow up.   Why:  walker and 3n[employer] Contact information: [address] [place] [address] [phone]        Gaynelle Arabian, MD. Schedule an appointment as soon as possible for a visit on 11/21/2017.   Specialty:  Orthopedic Surgery Contact information: [address] [place] [address] [phone]           Signed: Arlee Muslim, PA-C Orthopaedic Surgery 11/07/2017, 9:20 PM

## 2017-11-07 NOTE — Progress Notes (Addendum)
PT TREATMENT NOTE--PM SESSION    11/07/17 1300  PT Visit Information  Last PT Received On 11/07/17; PT PROGRESSING WELL THIS PM  Assistance Needed +1  History of Present Illness R TKA  Subjective Data  Patient Stated Goal get back to life  Precautions  Precautions Knee  Precaution Comments IND SLRs, KI not used; pt with foot propped on trash can, reviewed importance of terminal knee extension  Required Braces or Orthoses Knee Immobilizer - Right  Knee Immobilizer - Right Discontinue once straight leg raise with < 10 degree lag  Restrictions  Weight Bearing Restrictions No  Other Position/Activity Restrictions WBAT  Pain Assessment  Pain Assessment 0-10  Pain Score 7  Pain Location right knee  Pain Descriptors / Indicators Aching;Sore  Pain Intervention(s) Monitored during session;Premedicated before session;Repositioned;Patient requesting pain meds-RN notified  Cognition  Arousal/Alertness Awake/alert  Behavior During Therapy WFL for tasks assessed/performed  Overall Cognitive Status Within Functional Limits for tasks assessed  Bed Mobility  Overal bed mobility Needs Assistance  Bed Mobility Sit to Supine  Sit to supine Supervision  General bed mobility comments cues to self assist  Transfers  Overall transfer level Needs assistance  Equipment used Rolling walker (2 wheeled)  Transfers Sit to/from Stand  Sit to Stand Min guard;Supervision  General transfer comment cues for hand placement, and safety  Ambulation/Gait  Ambulation/Gait assistance Min guard;Supervision  Ambulation Distance (Feet) 55 Feet (10' more)  Assistive device Rolling walker (2 wheeled)  Gait Pattern/deviations Step-to pattern;Antalgic  General Gait Details cues for sequence, and posture  Total Joint Exercises  Ankle Circles/Pumps AROM;Both;10 reps  Quad Sets AROM;Both;5 reps;Supine  Heel Slides AROM;10 reps;Right;Limitations  Heel Slides Limitations pain  PT - End of Session  Equipment Utilized  During Treatment Gait belt  Activity Tolerance Patient tolerated treatment well  Patient left in bed;with call bell/phone within reach;with bed alarm set;with family/visitor present   PT - Assessment/Plan  PT Plan Current plan remains appropriate  PT Visit Diagnosis Unsteadiness on feet (R26.81);Difficulty in walking, not elsewhere classified (R26.2)  PT Frequency (ACUTE ONLY) 7X/week  Follow Up Recommendations Outpatient PT;Follow surgeon's recommendation for DC plan and follow-up therapies  PT equipment Rolling walker with 5" wheels  AM-PAC PT "6 Clicks" Daily Activity Outcome Measure  Difficulty turning over in bed (including adjusting bedclothes, sheets and blankets)? 3  Difficulty moving from lying on back to sitting on the side of the bed?  3  Difficulty sitting down on and standing up from a chair with arms (e.g., wheelchair, bedside commode, etc,.)? 3  Help needed moving to and from a bed to chair (including a wheelchair)? 3  Help needed walking in hospital room? 3  Help needed climbing 3-5 steps with a railing?  2  6 Click Score 17  Mobility G Code  CK  PT Goal Progression  Progress towards PT goals Progressing toward goals  Acute Rehab PT Goals  PT Goal Formulation With patient  Time For Goal Achievement 11/14/17  Potential to Achieve Goals Good  PT Time Calculation  PT Start Time (ACUTE ONLY) 1237  PT Stop Time (ACUTE ONLY) 1300  PT Time Calculation (min) (ACUTE ONLY) 23 min  PT General Charges  $$ ACUTE PT VISIT 1 Visit  PT Treatments  $Gait Training 8-22 mins  $Therapeutic Exercise 8-22 mins

## 2017-11-07 NOTE — Progress Notes (Signed)
Spoke with patient at bedside. Confirmed plan for OP PT, already arranged. Needs a RW and 3n1, contacted [facility] to deliver to room. [phone]

## 2017-11-07 NOTE — Progress Notes (Signed)
   Subjective: 1 Day Post-Op Procedure(s) (LRB): RIGHT TOTAL KNEE ARTHROPLASTY (Right) Patient reports pain as mild.   Patient seen in rounds for Dr. Wynelle Link. Doing okay on day one. Patient is well, but has had some minor complaints of pain in the knee, requiring pain medications We will start therapy today.  Plan is to go Home after hospital stay.  Objective: Vital signs in last 24 hours: Temp:  [97.8 F (36.6 C)-98.4 F (36.9 C)] 98.3 F (36.8 C) (04/02 1314) Pulse Rate:  [66-72] 67 (04/02 1314) Resp:  [15-18] 18 (04/02 1314) BP: (115-129)/(62-76) 124/73 (04/02 1314) SpO2:  [98 %-100 %] 100 % (04/02 1314)  Intake/Output from previous day:  Intake/Output Summary (Last 24 hours) at 11/07/2017 1825 Last data filed at 11/07/2017 1317 Gross per 24 hour  Intake 2140 ml  Output 1980 ml  Net 160 ml    Intake/Output this shift: Total I/O In: 720 [P.O.:720] Out: 300 [Urine:300]  Labs: Recent Labs    11/07/17 0543  HGB 10.7*   Recent Labs    11/07/17 0543  WBC 10.6*  RBC 3.68*  HCT 32.3*  PLT 234   Recent Labs    11/07/17 0543  NA 137  K 3.6  CL 102  CO2 28  BUN 10  CREATININE 0.63  GLUCOSE 121*  CALCIUM 8.5*   No results for input(s): LABPT, INR in the last 72 hours.  EXAM General - Patient is Alert, Appropriate and Oriented Extremity - Neurovascular intact Sensation intact distally Dressing - dressing C/D/I Motor Function - intact, moving foot and toes well on exam.  Hemovac pulled without difficulty.  Past Medical History:  Diagnosis Date  . Arthritis    bil knees  . GERD (gastroesophageal reflux disease)     Assessment/Plan: 1 Day Post-Op Procedure(s) (LRB): RIGHT TOTAL KNEE ARTHROPLASTY (Right) Principal Problem:   OA (osteoarthritis) of knee  Estimated body mass index is 33.28 kg/m as calculated from the following:   Height as of this encounter: 5\' 5"  (1.651 m).   Weight as of this encounter: 90.7 kg (200 lb). Advance diet Up with  therapy Plan for discharge tomorrow - straight to outpatient therapy at [facility] PT  DVT Prophylaxis - Xarelto Weight-Bearing as tolerated to right leg D/C O2 and Pulse OX and try on Room Air  Arlee Muslim, PA-C Orthopaedic Surgery 11/07/2017, 6:25 PM

## 2017-11-08 ENCOUNTER — Inpatient Hospital Stay (HOSPITAL_COMMUNITY): Payer: Managed Care, Other (non HMO)

## 2017-11-08 LAB — CBC
HEMATOCRIT: 31.2 % — AB (ref 36.0–46.0)
Hemoglobin: 10.1 g/dL — ABNORMAL LOW (ref 12.0–15.0)
MCH: 28.6 pg (ref 26.0–34.0)
MCHC: 32.4 g/dL (ref 30.0–36.0)
MCV: 88.4 fL (ref 78.0–100.0)
Platelets: 226 10*3/uL (ref 150–400)
RBC: 3.53 MIL/uL — ABNORMAL LOW (ref 3.87–5.11)
RDW: 13.1 % (ref 11.5–15.5)
WBC: 10.5 10*3/uL (ref 4.0–10.5)

## 2017-11-08 LAB — BASIC METABOLIC PANEL
ANION GAP: 7 (ref 5–15)
BUN: 13 mg/dL (ref 6–20)
CALCIUM: 8.3 mg/dL — AB (ref 8.9–10.3)
CO2: 28 mmol/L (ref 22–32)
Chloride: 94 mmol/L — ABNORMAL LOW (ref 101–111)
Creatinine, Ser: 0.61 mg/dL (ref 0.44–1.00)
GFR calc Af Amer: >60 mL/min (ref >60)
GFR calc non Af Amer: >60 mL/min (ref >60)
GLUCOSE: 122 mg/dL — AB (ref 65–99)
Potassium: 3.6 mmol/L (ref 3.5–5.1)
Sodium: 129 mmol/L — ABNORMAL LOW (ref 135–145)

## 2017-11-08 LAB — URINALYSIS, ROUTINE W REFLEX MICROSCOPIC
BACTERIA UA: NONE SEEN
BILIRUBIN URINE: NEGATIVE
GLUCOSE, UA: NEGATIVE mg/dL
HGB URINE DIPSTICK: NEGATIVE
Ketones, ur: NEGATIVE mg/dL
NITRITE: NEGATIVE
PROTEIN: NEGATIVE mg/dL
Specific Gravity, Urine: 1.012 (ref 1.005–1.030)
pH: 5 (ref 5.0–8.0)

## 2017-11-08 MED ORDER — LEVOFLOXACIN 500 MG PO TABS
500.0000 mg | ORAL_TABLET | ORAL | Status: DC
  Administered 2017-11-08: 500 mg via ORAL
  Filled 2017-11-08: qty 1

## 2017-11-08 MED ORDER — LEVOFLOXACIN 500 MG PO TABS: 500.0000 mg | ORAL_TABLET | ORAL | 0 refills | Status: AC

## 2017-11-08 NOTE — Progress Notes (Signed)
Physical Therapy Treatment Patient Details Name: Heidi Reeves MRN: [id number] DOB: [birth year] Today's Date: 11/08/2017    History of Present Illness R TKA    PT Comments    Pt reports increased pain last night however currently feeling okay.  Pt wished to perform 2 session today so ambulated and educated on safe stair technique this morning and will return this afternoon to review HEP.   Follow Up Recommendations  Outpatient PT;Follow surgeon's recommendation for DC plan and follow-up therapies     Equipment Recommendations  Rolling walker with 5" wheels    Recommendations for Other Services       Precautions / Restrictions Precautions Precautions: Knee Restrictions Other Position/Activity Restrictions: WBAT    Mobility  Bed Mobility Overal bed mobility: Needs Assistance Bed Mobility: Supine to Sit     Supine to sit: Min assist     General bed mobility comments: assist for R LE due to pain , pt reports "rough" night  Transfers Overall transfer level: Needs assistance Equipment used: Rolling walker (2 wheeled) Transfers: Sit to/from Stand Sit to Stand: Min guard         General transfer comment: cues for hand placement, and safety  Ambulation/Gait Ambulation/Gait assistance: Min guard Ambulation Distance (Feet): 80 Feet Assistive device: Rolling walker (2 wheeled) Gait Pattern/deviations: Step-to pattern;Antalgic;Step-through pattern;Decreased stance time - right     General Gait Details: cues for RW positioning, heel strike, step length and posture   Stairs Stairs: Yes   Stair Management: Step to pattern;Backwards;With walker Number of Stairs: 3 General stair comments: verbal cues for sequence, RW positioning, safety, pt reports understanding, friend to assist her into home and aware RW needs to be held for safety  Wheelchair Mobility    Modified Rankin (Stroke Patients Only)       Balance                                             Cognition Arousal/Alertness: Awake/alert Behavior During Therapy: WFL for tasks assessed/performed Overall Cognitive Status: Within Functional Limits for tasks assessed                                        Exercises      General Comments        Pertinent Vitals/Pain Pain Assessment: 0-10 Pain Score: 4  Pain Location: right knee Pain Descriptors / Indicators: Aching;Sore Pain Intervention(s): Limited activity within patient's tolerance;Repositioned;Monitored during session;Premedicated before session    Home Living                      Prior Function            PT Goals (current goals can now be found in the care plan section) Progress towards PT goals: Progressing toward goals    Frequency    7X/week      PT Plan Current plan remains appropriate    Co-evaluation              AM-PAC PT "6 Clicks" Daily Activity  Outcome Measure  Difficulty turning over in bed (including adjusting bedclothes, sheets and blankets)?: A Little Difficulty moving from lying on back to sitting on the side of the bed? : A Little Difficulty sitting down on and standing up  from a chair with arms (e.g., wheelchair, bedside commode, etc,.)?: A Little Help needed moving to and from a bed to chair (including a wheelchair)?: A Little Help needed walking in hospital room?: A Little Help needed climbing 3-5 steps with a railing? : A Little 6 Click Score: 18    End of Session Equipment Utilized During Treatment: Gait belt Activity Tolerance: Patient tolerated treatment well Patient left: with call bell/phone within reach;in chair   PT Visit Diagnosis: Unsteadiness on feet (R26.81);Difficulty in walking, not elsewhere classified (R26.2)     Time: 4665-9935 PT Time Calculation (min) (ACUTE ONLY): 20 min  Charges:  $Gait Training: 8-22 mins                    G Codes:      Carmelia Bake, PT, DPT 11/08/2017 Pager: [phone]  [name]  E 11/08/2017, 12:30 PM

## 2017-11-08 NOTE — Plan of Care (Signed)
  Problem: Nutrition: °Goal: Adequate nutrition will be maintained °Outcome: Progressing °  °Problem: Activity: °Goal: Risk for activity intolerance will decrease °Outcome: Progressing °  °Problem: Elimination: °Goal: Will not experience complications related to bowel motility °Outcome: Progressing °  °

## 2017-11-08 NOTE — Progress Notes (Signed)
   Subjective: 2 Days Post-Op Procedure(s) (LRB): RIGHT TOTAL KNEE ARTHROPLASTY (Right) Patient reports pain as mild.   Patient seen in rounds by Dr. Wynelle Link. Patient is well, but has had some minor complaints of pain in the knee, requiring pain medications Patient is ready to go home  Objective: Vital signs in last 24 hours: Temp:  [98.1 F (36.7 C)-101.8 F (38.8 C)] 98.1 F (36.7 C) (04/03 1600) Pulse Rate:  [68-79] 79 (04/03 1429) Resp:  [15-16] 15 (04/03 1429) BP: (111-132)/(65-84) 132/73 (04/03 1429) SpO2:  [92 %-96 %] 92 % (04/03 1429)  Intake/Output from previous day:  Intake/Output Summary (Last 24 hours) at 11/08/2017 1741 Last data filed at 11/08/2017 1500 Gross per 24 hour  Intake 850 ml  Output 200 ml  Net 650 ml    Intake/Output this shift: Total I/O In: 480 [P.O.:480] Out: 200 [Urine:200]  Labs: Recent Labs    11/07/17 0543 11/08/17 0547  HGB 10.7* 10.1*   Recent Labs    11/07/17 0543 11/08/17 0547  WBC 10.6* 10.5  RBC 3.68* 3.53*  HCT 32.3* 31.2*  PLT 234 226   Recent Labs    11/07/17 0543 11/08/17 0547  NA 137 129*  K 3.6 3.6  CL 102 94*  CO2 28 28  BUN 10 13  CREATININE 0.63 0.61  GLUCOSE 121* 122*  CALCIUM 8.5* 8.3*   No results for input(s): LABPT, INR in the last 72 hours.  EXAM: General - Patient is Alert, Appropriate and Oriented Extremity - Neurovascular intact Sensation intact distally Intact pulses distally Dorsiflexion/Plantar flexion intact Incision - clean, dry Motor Function - intact, moving foot and toes well on exam.   Assessment/Plan: 2 Days Post-Op Procedure(s) (LRB): RIGHT TOTAL KNEE ARTHROPLASTY (Right) Procedure(s) (LRB): RIGHT TOTAL KNEE ARTHROPLASTY (Right) Past Medical History:  Diagnosis Date  . Arthritis    bil knees  . GERD (gastroesophageal reflux disease)    Principal Problem:   OA (osteoarthritis) of knee  Estimated body mass index is 33.28 kg/m as calculated from the following:  Height as of this encounter: 5\' 5"  (1.651 m).   Weight as of this encounter: 90.7 kg (200 lb). Up with therapy Diet - Regular diet Follow up - in 2 weeks Activity - WBAT Disposition - Home Condition Upon Discharge - Stable D/C Meds - See DC Summary DVT Prophylaxis - [place], PA-C Orthopaedic Surgery 11/08/2017, 5:41 PM

## 2017-11-08 NOTE — Progress Notes (Signed)
Physical Therapy Treatment Patient Details Name: Heidi Reeves MRN: [id number] DOB: 1960-08-20 Today's Date: 11/08/2017    History of Present Illness R TKA    PT Comments    Pt performed LE exercises in bed while reviewing HEP handout.  Pt ambulated in hallway and feels ready for d/c home today.   Follow Up Recommendations  Outpatient PT;Follow surgeon's recommendation for DC plan and follow-up therapies     Equipment Recommendations  Rolling walker with 5" wheels    Recommendations for Other Services       Precautions / Restrictions Precautions Precautions: Knee Restrictions Other Position/Activity Restrictions: WBAT    Mobility  Bed Mobility Overal bed mobility: Needs Assistance Bed Mobility: Supine to Sit     Supine to sit: Supervision     General bed mobility comments: verbal cues for self assist  Transfers Overall transfer level: Needs assistance Equipment used: Rolling walker (2 wheeled) Transfers: Sit to/from Stand Sit to Stand: Supervision         General transfer comment: cues for hand placement, and safety  Ambulation/Gait Ambulation/Gait assistance: Supervision Ambulation Distance (Feet): 80 Feet Assistive device: Rolling walker (2 wheeled) Gait Pattern/deviations: Antalgic;Step-through pattern;Decreased stance time - right     General Gait Details: cues for RW positioning, heel strike, step length and posture   Stairs     Wheelchair Mobility    Modified Rankin (Stroke Patients Only)       Balance                                            Cognition Arousal/Alertness: Awake/alert Behavior During Therapy: WFL for tasks assessed/performed Overall Cognitive Status: Within Functional Limits for tasks assessed                                        Exercises Total Joint Exercises Ankle Circles/Pumps: AROM;Both;10 reps Quad Sets: AROM;Both;Supine;10 reps Towel Squeeze: AROM;10  reps;Both Short Arc Quad: AAROM;10 reps;Right Heel Slides: AAROM;Right;10 reps Hip ABduction/ADduction: AAROM;10 reps;Right Straight Leg Raises: Right;AAROM;10 reps    General Comments        Pertinent Vitals/Pain Pain Assessment: 0-10 Pain Score: 3  Pain Location: right knee Pain Descriptors / Indicators: Aching;Sore Pain Intervention(s): Limited activity within patient's tolerance;Repositioned;Monitored during session    Home Living                      Prior Function            PT Goals (current goals can now be found in the care plan section) Progress towards PT goals: Progressing toward goals    Frequency    7X/week      PT Plan Current plan remains appropriate    Co-evaluation              AM-PAC PT "6 Clicks" Daily Activity  Outcome Measure  Difficulty turning over in bed (including adjusting bedclothes, sheets and blankets)?: A Little Difficulty moving from lying on back to sitting on the side of the bed? : A Little Difficulty sitting down on and standing up from a chair with arms (e.g., wheelchair, bedside commode, etc,.)?: A Little Help needed moving to and from a bed to chair (including a wheelchair)?: A Little Help needed walking in hospital room?: A Little  Help needed climbing 3-5 steps with a railing? : A Little 6 Click Score: 18    End of Session Equipment Utilized During Treatment: Gait belt Activity Tolerance: Patient tolerated treatment well Patient left: with call bell/phone within reach;with family/visitor present   PT Visit Diagnosis: Unsteadiness on feet (R26.81);Difficulty in walking, not elsewhere classified (R26.2)     Time: 6754-4920 PT Time Calculation (min) (ACUTE ONLY): 20 min  Charges:   $Therapeutic Exercise: 8-22 mins                    G Codes:       Carmelia Bake, PT, DPT 11/08/2017 Pager: [phone] [name] E 11/08/2017, 1:44 PM

## 2017-11-08 NOTE — Progress Notes (Signed)
11/08/17  1810  Reviewed discharge instructions with patient. Patient verbalized understanding of discharge instructions. Copy of discharge instructions and prescriptions given to patient.

## 2018-12-15 IMAGING — DX DG CHEST 2V
2 series · 2 of 2 positions shown · non-contrast
Comparison: None.

CLINICAL DATA: 56 y/o F; postop total right knee replacement.
Fever.

EXAM:
CHEST - 2 VIEW

[chest pa]
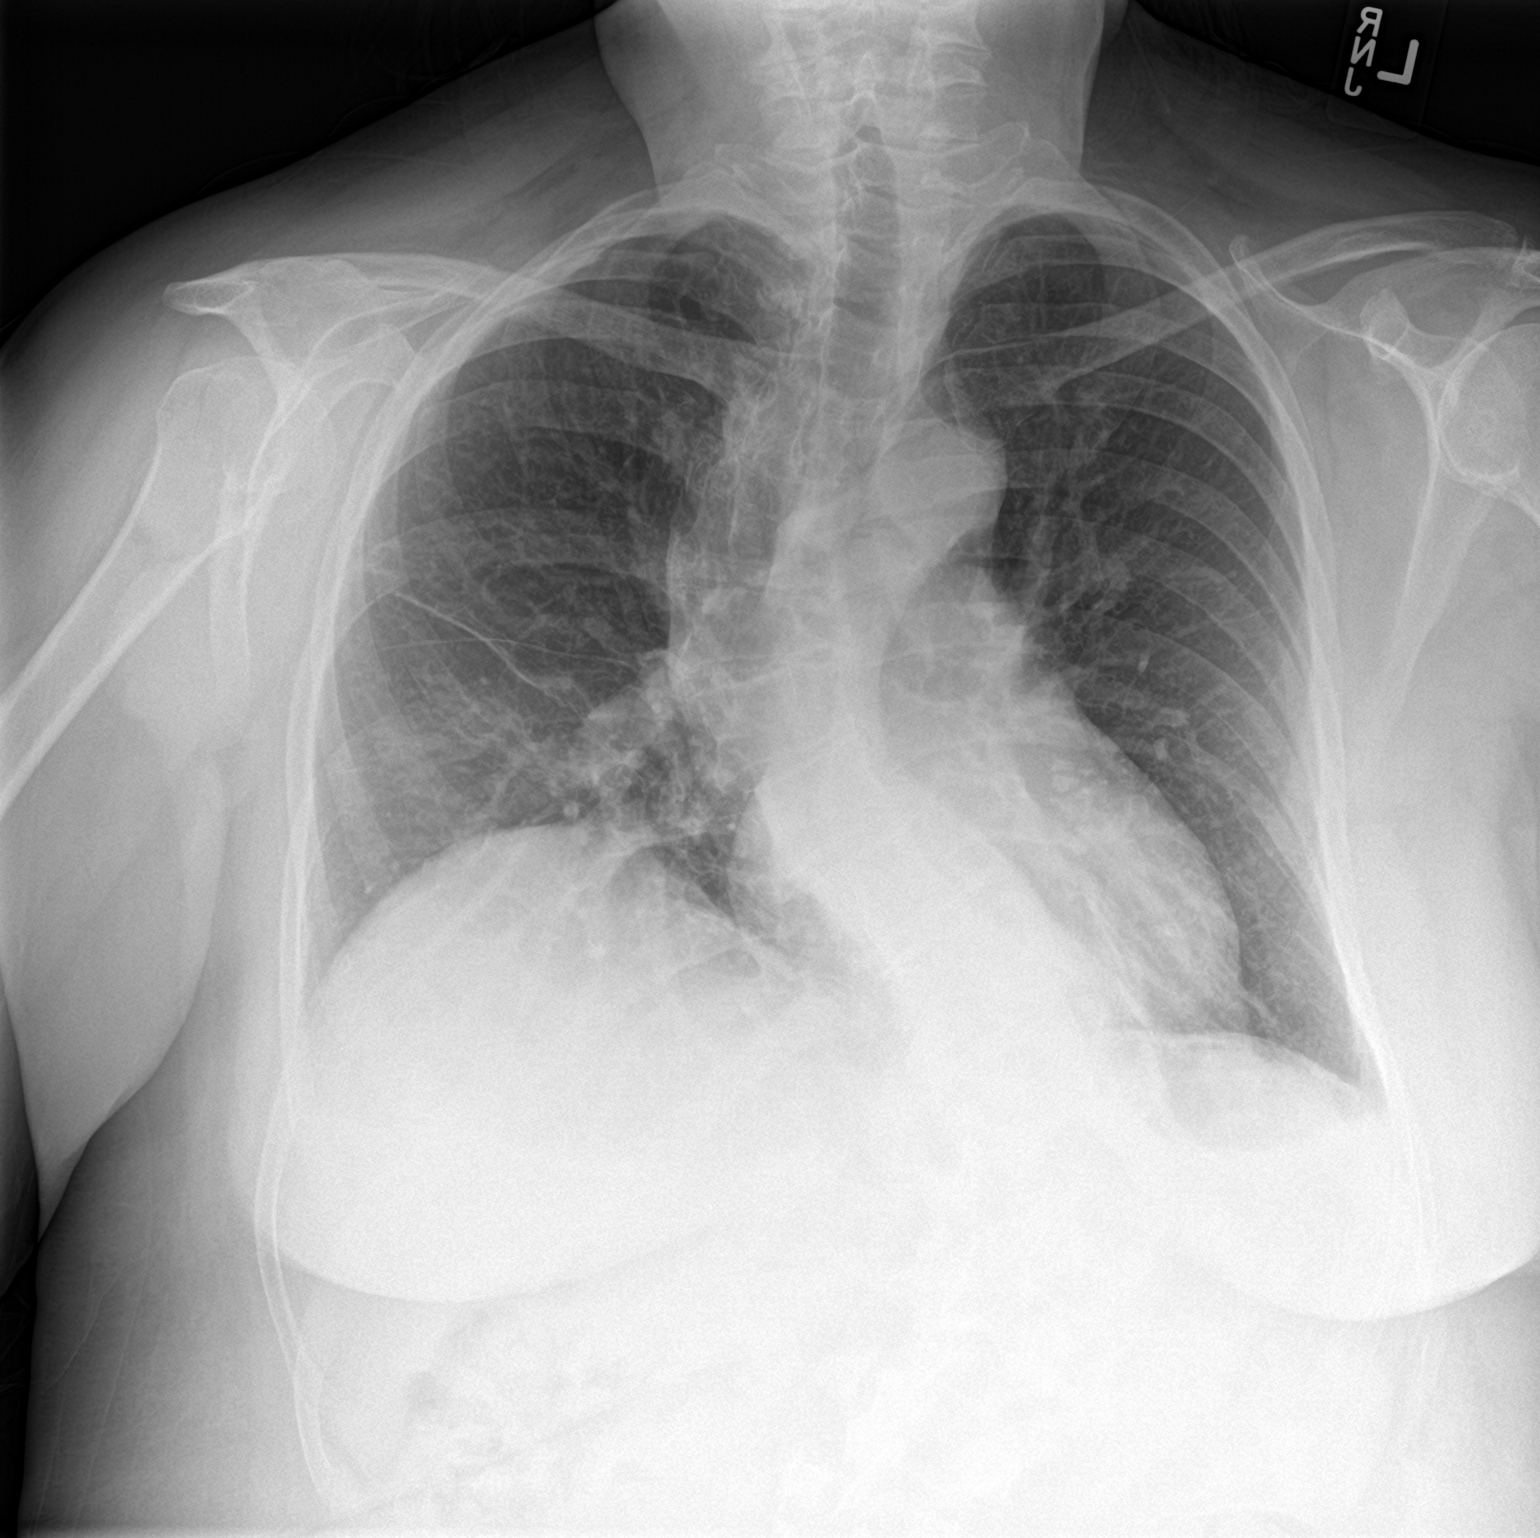

[chest lat]
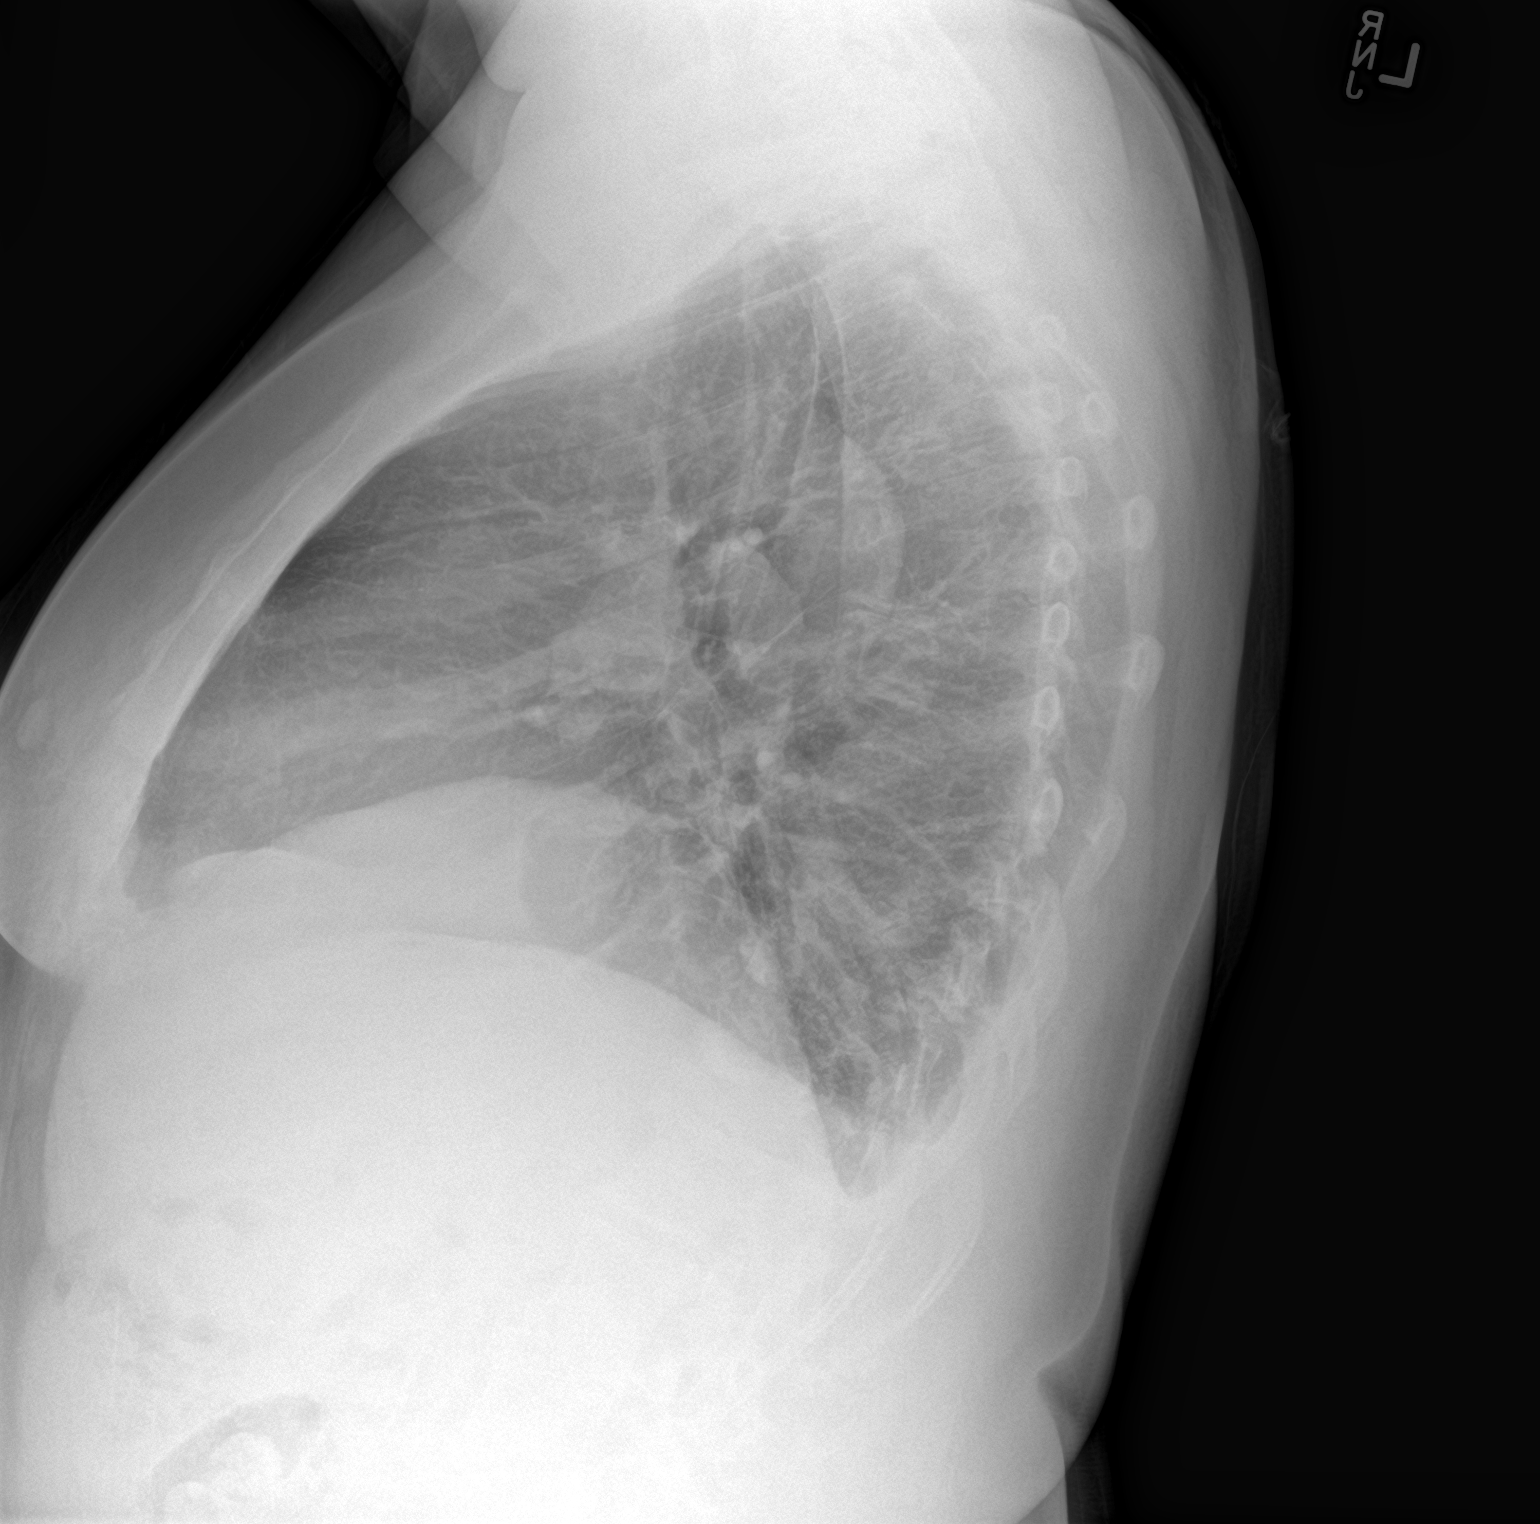

[2 of 2 positions shown; findings below may reference images not displayed]

FINDINGS: Normal cardiac silhouette. Ill-defined opacities within bilateral
lower lobes. Right mid lung zone linear opacity, probably platelike
atelectasis. No pleural effusion or pneumothorax. Severe S-shaped
curvature of the spine.
IMPRESSION: Ill-defined opacities within the lower lobes may represent
atelectasis or pneumonia.

By: Fuchao Heskens M.D.

## 2019-10-07 HISTORY — PX: TOTAL KNEE ARTHROPLASTY: SHX125

## 2020-01-29 ENCOUNTER — Encounter: Payer: Self-pay | Admitting: Gastroenterology

## 2020-03-25 ENCOUNTER — Encounter: Payer: Managed Care, Other (non HMO) | Admitting: Gastroenterology

## 2020-06-12 ENCOUNTER — Encounter: Payer: Self-pay | Admitting: Gastroenterology

## 2020-06-26 ENCOUNTER — Telehealth: Payer: Self-pay

## 2020-06-26 NOTE — Telephone Encounter (Signed)
Called and spoke with pt.  She confirmed she was not taking any kind of blood thinner.  The Xarelto noted in her chart was given during a joint replacement surgery.  PV and procedure will continue as scheduled.

## 2020-07-28 ENCOUNTER — Ambulatory Visit (AMBULATORY_SURGERY_CENTER): Payer: Managed Care, Other (non HMO)

## 2020-07-28 VITALS — Ht 64.0 in | Wt 195.0 lb

## 2020-07-28 DIAGNOSIS — Z1211 Encounter for screening for malignant neoplasm of colon: Secondary | ICD-10-CM

## 2020-07-28 NOTE — Progress Notes (Signed)
No egg or soy allergy known to patient  No issues with past sedation with any surgeries or procedures No intubation problems in the past  No FH of Malignant Hyperthermia No diet pills per patient No home 02 use per patient  No blood thinners per patient  Pt denies issues with constipation  No A fib or A flutter  EMMI video to pt or via MyChart  COVID 19 guidelines implemented in PV today with Pt and RN  Pt is fully vaccinated  for Covid   VIRTUAL PREVISIT  Due to the COVID-19 pandemic we are asking patients to follow certain guidelines.  Pt aware of COVID protocols and [facility] guidelines   

## 2020-08-05 ENCOUNTER — Encounter: Payer: Managed Care, Other (non HMO) | Admitting: Gastroenterology

## 2020-08-05 ENCOUNTER — Encounter: Payer: Self-pay | Admitting: Gastroenterology

## 2020-08-10 ENCOUNTER — Encounter: Payer: Self-pay | Admitting: Gastroenterology

## 2020-08-12 ENCOUNTER — Encounter: Payer: Self-pay | Admitting: Gastroenterology

## 2020-08-12 ENCOUNTER — Ambulatory Visit (AMBULATORY_SURGERY_CENTER): Payer: 59 | Admitting: Gastroenterology

## 2020-08-12 ENCOUNTER — Other Ambulatory Visit: Payer: Self-pay

## 2020-08-12 VITALS — BP 118/82 | HR 61 | Temp 97.5°F | Resp 12 | Ht 64.0 in | Wt 195.0 lb

## 2020-08-12 DIAGNOSIS — Z1211 Encounter for screening for malignant neoplasm of colon: Secondary | ICD-10-CM

## 2020-08-12 DIAGNOSIS — D123 Benign neoplasm of transverse colon: Secondary | ICD-10-CM

## 2020-08-12 MED ORDER — SODIUM CHLORIDE 0.9 % IV SOLN: 500.0000 mL | Freq: Once | INTRAVENOUS | Status: DC

## 2020-08-12 NOTE — Progress Notes (Signed)
Called to room to assist during endoscopic procedure.  Patient ID and intended procedure confirmed with present staff. Received instructions for my participation in the procedure from the performing physician.  

## 2020-08-12 NOTE — Patient Instructions (Signed)
YOU HAD AN ENDOSCOPIC PROCEDURE TODAY AT THE [facility]:   Refer to the procedure report that was given to you for any specific questions about what was found during the examination.  If the procedure report does not answer your questions, please call your gastroenterologist to clarify.  If you requested that your care partner not be given the details of your procedure findings, then the procedure report has been included in a sealed envelope for you to review at your convenience later.  YOU SHOULD EXPECT: Some feelings of bloating in the abdomen. Passage of more gas than usual.  Walking can help get rid of the air that was put into your GI tract during the procedure and reduce the bloating. If you had a lower endoscopy (such as a colonoscopy or flexible sigmoidoscopy) you may notice spotting of blood in your stool or on the toilet paper. If you underwent a bowel prep for your procedure, you may not have a normal bowel movement for a few days.  Please Note:  You might notice some irritation and congestion in your nose or some drainage.  This is from the oxygen used during your procedure.  There is no need for concern and it should clear up in a day or so.  SYMPTOMS TO REPORT IMMEDIATELY:   Following lower endoscopy (colonoscopy or flexible sigmoidoscopy):  Excessive amounts of blood in the stool  Significant tenderness or worsening of abdominal pains  Swelling of the abdomen that is new, acute  Fever of 100F or higher  For urgent or emergent issues, a gastroenterologist can be reached at any hour by calling [phone]. Do not use MyChart messaging for urgent concerns.    DIET:  We do recommend a small meal at first, but then you may proceed to your regular diet.  Drink plenty of fluids but you should avoid alcoholic beverages for 24 hours.  ACTIVITY:  You should plan to take it easy for the rest of today and you should NOT DRIVE or use heavy machinery until tomorrow (because  of the sedation medicines used during the test).    FOLLOW UP: Our staff will call the number listed on your records 48-72 hours following your procedure to check on you and address any questions or concerns that you may have regarding the information given to you following your procedure. If we do not reach you, we will leave a message.  We will attempt to reach you two times.  During this call, we will ask if you have developed any symptoms of COVID 19. If you develop any symptoms (ie: fever, flu-like symptoms, shortness of breath, cough etc.) before then, please call [phone].  If you test positive for Covid 19 in the 2 weeks post procedure, please call and report this information to us.    If any biopsies were taken you will be contacted by phone or by letter within the next 1-3 weeks.  Please call us at [phone] if you have not heard about the biopsies in 3 weeks.    SIGNATURES/CONFIDENTIALITY: You and/or your care partner have signed paperwork which will be entered into your electronic medical record.  These signatures attest to the fact that that the information above on your After Visit Summary has been reviewed and is understood.  Full responsibility of the confidentiality of this discharge information lies with you and/or your care-partner. 

## 2020-08-12 NOTE — Progress Notes (Signed)
Report given to PACU, vss 

## 2020-08-12 NOTE — Op Note (Addendum)
[facility] Patient Name: Heidi Reeves Procedure Date: 08/12/2020 2:10 PM MRN: [id number] Endoscopist: Milus Banister , MD Age: 60 Referring MD:  Date of Birth: 23-Feb-1961 Gender: Female Account #: [id number] Procedure:                Colonoscopy Indications:              Screening for colorectal malignant neoplasm Medicines:                Monitored Anesthesia Care Procedure:                Pre-Anesthesia Assessment:                           - Prior to the procedure, a History and Physical                            was performed, and patient medications and                            allergies were reviewed. The patient's tolerance of                            previous anesthesia was also reviewed. The risks                            and benefits of the procedure and the sedation                            options and risks were discussed with the patient.                            All questions were answered, and informed consent                            was obtained. Prior Anticoagulants: The patient has                            taken no previous anticoagulant or antiplatelet                            agents. ASA Grade Assessment: II - A patient with                            mild systemic disease. After reviewing the risks                            and benefits, the patient was deemed in                            satisfactory condition to undergo the procedure.                           After obtaining informed consent, the colonoscope  was passed under direct vision. Throughout the                            procedure, the patient's blood pressure, pulse, and                            oxygen saturations were monitored continuously. The                            Olympus PFC-[id number] (#[id number]) Colonoscope was                            introduced through the anus and advanced to the the                            cecum,  identified by appendiceal orifice and                            ileocecal valve. The colonoscopy was performed                            without difficulty. The patient tolerated the                            procedure well. The quality of the bowel                            preparation was good. The ileocecal valve,                            appendiceal orifice, and rectum were photographed. Scope In: 2:14:27 PM Scope Out: 2:27:45 PM Scope Withdrawal Time: 0 hours 9 minutes 15 seconds  Total Procedure Duration: 0 hours 13 minutes 18 seconds  Findings:                 Multiple small and large-mouthed diverticula were                            found in the left colon.                           One 34mm sessile polyp was removed from the                            transverse colon with cold biopsy forceps.                           The exam was otherwise without abnormality on                            direct and retroflexion views. Complications:            No immediate complications. Estimated blood loss:  None. Estimated Blood Loss:     Estimated blood loss: none. Impression:               - Diverticulosis in the left colon.                           - One 53mm sessile polyp was removed from the                            transverse colon with cold biopsy forceps.                           - The examination was otherwise normal on direct                            and retroflexion views. Recommendation:           - Patient has a contact number available for                            emergencies. The signs and symptoms of potential                            delayed complications were discussed with the                            patient. Return to normal activities tomorrow.                            Written discharge instructions were provided to the                            patient.                           - Resume previous diet.                            - Continue present medications.                           - await final pathology report. Milus Banister, MD 08/12/2020 2:29:58 PM This report has been signed electronically.

## 2020-08-14 ENCOUNTER — Telehealth: Payer: Self-pay

## 2020-08-14 NOTE — Telephone Encounter (Signed)
Covid-19 screening questions   Do you now or have you had a fever in the last 14 days? No. Do you have any respiratory symptoms of shortness of breath or cough now or in the last 14 days? No.  Do you have any family members or close contacts with diagnosed or suspected Covid-19 in the past 14 days? No.  Have you been tested for Covid-19 and found to be positive? No.       Follow up Call-  Call back number 08/12/2020  Post procedure Call Back phone  # [phone]  Permission to leave phone message Yes  Some recent data might be hidden     Patient questions:  Do you have a fever, pain , or abdominal swelling? No. Pain Score  0 *  Have you tolerated food without any problems? Yes.    Have you been able to return to your normal activities? Yes.    Do you have any questions about your discharge instructions: Diet   No. Medications  No. Follow up visit  No.  Do you have questions or concerns about your Care? No.  Actions: * If pain score is 4 or above: No action needed, pain <4.

## 2020-08-20 ENCOUNTER — Encounter: Payer: Self-pay | Admitting: Gastroenterology
